# Patient Record
Sex: Male | Born: 1935 | Race: White | Hispanic: No | Marital: Married | Smoking: Former smoker
Health system: Southern US, Community
[De-identification: ages and names within clinical notes are randomized; demographics above are authoritative.]

## PROBLEM LIST (undated history)

## (undated) DIAGNOSIS — M549 Dorsalgia, unspecified: Secondary | ICD-10-CM

## (undated) DIAGNOSIS — G20 Parkinson's disease: Secondary | ICD-10-CM

## (undated) DIAGNOSIS — T7840XA Allergy, unspecified, initial encounter: Secondary | ICD-10-CM

## (undated) DIAGNOSIS — R911 Solitary pulmonary nodule: Secondary | ICD-10-CM

## (undated) DIAGNOSIS — G4733 Obstructive sleep apnea (adult) (pediatric): Secondary | ICD-10-CM

## (undated) DIAGNOSIS — M2550 Pain in unspecified joint: Secondary | ICD-10-CM

## (undated) DIAGNOSIS — N189 Chronic kidney disease, unspecified: Secondary | ICD-10-CM

## (undated) DIAGNOSIS — I700 Atherosclerosis of aorta: Secondary | ICD-10-CM

## (undated) DIAGNOSIS — I10 Essential (primary) hypertension: Secondary | ICD-10-CM

## (undated) DIAGNOSIS — N19 Unspecified kidney failure: Secondary | ICD-10-CM

## (undated) DIAGNOSIS — E785 Hyperlipidemia, unspecified: Secondary | ICD-10-CM

## (undated) DIAGNOSIS — C61 Malignant neoplasm of prostate: Secondary | ICD-10-CM

## (undated) DIAGNOSIS — N400 Enlarged prostate without lower urinary tract symptoms: Secondary | ICD-10-CM

## (undated) DIAGNOSIS — I2699 Other pulmonary embolism without acute cor pulmonale: Secondary | ICD-10-CM

## (undated) DIAGNOSIS — N529 Male erectile dysfunction, unspecified: Secondary | ICD-10-CM

## (undated) DIAGNOSIS — I7143 Infrarenal abdominal aortic aneurysm, without rupture: Secondary | ICD-10-CM

## (undated) DIAGNOSIS — E7800 Pure hypercholesterolemia, unspecified: Secondary | ICD-10-CM

## (undated) DIAGNOSIS — G4730 Sleep apnea, unspecified: Secondary | ICD-10-CM

## (undated) DIAGNOSIS — R058 Other specified cough: Secondary | ICD-10-CM

## (undated) DIAGNOSIS — R55 Syncope and collapse: Secondary | ICD-10-CM

## (undated) DIAGNOSIS — E039 Hypothyroidism, unspecified: Secondary | ICD-10-CM

## (undated) DIAGNOSIS — J449 Chronic obstructive pulmonary disease, unspecified: Secondary | ICD-10-CM

## (undated) DIAGNOSIS — T464X5A Adverse effect of angiotensin-converting-enzyme inhibitors, initial encounter: Secondary | ICD-10-CM

## (undated) DIAGNOSIS — G20A1 Parkinson's disease without dyskinesia, without mention of fluctuations: Secondary | ICD-10-CM

## (undated) DIAGNOSIS — J988 Other specified respiratory disorders: Secondary | ICD-10-CM

## (undated) DIAGNOSIS — I714 Abdominal aortic aneurysm, without rupture, unspecified: Secondary | ICD-10-CM

## (undated) HISTORY — PX: CATARACT EXTRACTION, BILATERAL: SHX1313

## (undated) HISTORY — DX: Hypothyroidism, unspecified: E03.9

## (undated) HISTORY — DX: Abdominal aortic aneurysm, without rupture, unspecified: I71.40

## (undated) HISTORY — DX: Syncope and collapse: R55

## (undated) HISTORY — DX: Male erectile dysfunction, unspecified: N52.9

## (undated) HISTORY — DX: Malignant neoplasm of prostate: C61

## (undated) HISTORY — DX: Unspecified kidney failure: N19

## (undated) HISTORY — DX: Parkinson's disease without dyskinesia, without mention of fluctuations: G20.A1

## (undated) HISTORY — DX: Chronic kidney disease, unspecified: N18.9

## (undated) HISTORY — DX: Pain in unspecified joint: M25.50

## (undated) HISTORY — DX: Pure hypercholesterolemia, unspecified: E78.00

## (undated) HISTORY — DX: Chronic obstructive pulmonary disease, unspecified: J44.9

## (undated) HISTORY — DX: Benign prostatic hyperplasia without lower urinary tract symptoms: N40.0

## (undated) HISTORY — DX: Dorsalgia, unspecified: M54.9

## (undated) HISTORY — DX: Allergy, unspecified, initial encounter: T78.40XA

## (undated) HISTORY — DX: Parkinson's disease: G20

## (undated) HISTORY — DX: Hyperlipidemia, unspecified: E78.5

## (undated) HISTORY — DX: Adverse effect of angiotensin-converting-enzyme inhibitors, initial encounter: T46.4X5A

## (undated) HISTORY — DX: Atherosclerosis of aorta: I70.0

## (undated) HISTORY — DX: Other specified respiratory disorders: J98.8

## (undated) HISTORY — DX: Solitary pulmonary nodule: R91.1

## (undated) HISTORY — DX: Sleep apnea, unspecified: G47.30

## (undated) HISTORY — PX: PROSTATE SURGERY: SHX751

## (undated) HISTORY — DX: Abdominal aortic aneurysm, without rupture: I71.4

## (undated) HISTORY — DX: Infrarenal abdominal aortic aneurysm, without rupture: I71.43

## (undated) HISTORY — DX: Essential (primary) hypertension: I10

## (undated) HISTORY — DX: Obstructive sleep apnea (adult) (pediatric): G47.33

## (undated) HISTORY — DX: Other specified cough: R05.8

## (undated) HISTORY — PX: PROSTATE CRYOABLATION: SUR358

## (undated) HISTORY — DX: Other pulmonary embolism without acute cor pulmonale: I26.99

## (undated) HISTORY — PX: OTHER SURGICAL HISTORY: SHX169

---

## 1999-06-30 ENCOUNTER — Encounter: Payer: Self-pay | Admitting: Pulmonary Disease

## 1999-09-20 ENCOUNTER — Ambulatory Visit: Admission: RE | Admit: 1999-09-20 | Discharge: 1999-09-20 | Payer: Self-pay | Admitting: Pulmonary Disease

## 1999-09-20 ENCOUNTER — Encounter: Payer: Self-pay | Admitting: Pulmonary Disease

## 1999-11-21 ENCOUNTER — Encounter: Admission: RE | Admit: 1999-11-21 | Discharge: 2000-02-19 | Payer: Self-pay | Admitting: Pulmonary Disease

## 2000-01-29 ENCOUNTER — Ambulatory Visit (HOSPITAL_BASED_OUTPATIENT_CLINIC_OR_DEPARTMENT_OTHER): Admission: RE | Admit: 2000-01-29 | Discharge: 2000-01-29 | Payer: Self-pay | Admitting: Pulmonary Disease

## 2001-11-18 ENCOUNTER — Encounter: Admission: RE | Admit: 2001-11-18 | Discharge: 2001-11-18 | Payer: Self-pay | Admitting: Internal Medicine

## 2001-11-18 ENCOUNTER — Encounter: Payer: Self-pay | Admitting: Internal Medicine

## 2002-10-07 ENCOUNTER — Ambulatory Visit (HOSPITAL_BASED_OUTPATIENT_CLINIC_OR_DEPARTMENT_OTHER): Admission: RE | Admit: 2002-10-07 | Discharge: 2002-10-07 | Payer: Self-pay | Admitting: Orthopedic Surgery

## 2004-04-25 ENCOUNTER — Inpatient Hospital Stay (HOSPITAL_COMMUNITY): Admission: AD | Admit: 2004-04-25 | Discharge: 2004-04-27 | Payer: Self-pay | Admitting: Internal Medicine

## 2004-06-03 ENCOUNTER — Ambulatory Visit (HOSPITAL_COMMUNITY): Admission: RE | Admit: 2004-06-03 | Discharge: 2004-06-03 | Payer: Self-pay | Admitting: Internal Medicine

## 2004-07-31 HISTORY — PX: OTHER SURGICAL HISTORY: SHX169

## 2004-10-11 ENCOUNTER — Ambulatory Visit (HOSPITAL_COMMUNITY): Admission: RE | Admit: 2004-10-11 | Discharge: 2004-10-11 | Payer: Self-pay | Admitting: Internal Medicine

## 2005-11-17 ENCOUNTER — Inpatient Hospital Stay (HOSPITAL_COMMUNITY): Admission: EM | Admit: 2005-11-17 | Discharge: 2005-11-25 | Payer: Self-pay | Admitting: Emergency Medicine

## 2005-11-21 ENCOUNTER — Encounter (INDEPENDENT_AMBULATORY_CARE_PROVIDER_SITE_OTHER): Payer: Self-pay | Admitting: *Deleted

## 2005-12-08 ENCOUNTER — Encounter: Admission: RE | Admit: 2005-12-08 | Discharge: 2005-12-08 | Payer: Self-pay | Admitting: Cardiothoracic Surgery

## 2005-12-13 ENCOUNTER — Ambulatory Visit: Payer: Self-pay | Admitting: Pulmonary Disease

## 2006-11-29 ENCOUNTER — Ambulatory Visit: Payer: Self-pay | Admitting: Pulmonary Disease

## 2007-01-28 ENCOUNTER — Ambulatory Visit: Payer: Self-pay | Admitting: Pulmonary Disease

## 2007-10-31 ENCOUNTER — Telehealth (INDEPENDENT_AMBULATORY_CARE_PROVIDER_SITE_OTHER): Payer: Self-pay | Admitting: *Deleted

## 2007-11-30 ENCOUNTER — Encounter: Payer: Self-pay | Admitting: Pulmonary Disease

## 2007-11-30 DIAGNOSIS — I10 Essential (primary) hypertension: Secondary | ICD-10-CM | POA: Insufficient documentation

## 2007-11-30 DIAGNOSIS — J189 Pneumonia, unspecified organism: Secondary | ICD-10-CM | POA: Insufficient documentation

## 2007-11-30 DIAGNOSIS — J438 Other emphysema: Secondary | ICD-10-CM | POA: Insufficient documentation

## 2008-06-08 ENCOUNTER — Ambulatory Visit: Payer: Self-pay | Admitting: Pulmonary Disease

## 2008-07-04 ENCOUNTER — Encounter: Payer: Self-pay | Admitting: Pulmonary Disease

## 2008-09-01 ENCOUNTER — Ambulatory Visit (HOSPITAL_COMMUNITY): Admission: RE | Admit: 2008-09-01 | Discharge: 2008-09-01 | Payer: Self-pay | Admitting: Urology

## 2008-12-01 ENCOUNTER — Encounter: Admission: RE | Admit: 2008-12-01 | Discharge: 2008-12-01 | Payer: Self-pay | Admitting: Urology

## 2008-12-07 ENCOUNTER — Ambulatory Visit (HOSPITAL_BASED_OUTPATIENT_CLINIC_OR_DEPARTMENT_OTHER): Admission: RE | Admit: 2008-12-07 | Discharge: 2008-12-07 | Payer: Self-pay | Admitting: Urology

## 2008-12-16 ENCOUNTER — Ambulatory Visit: Payer: Self-pay | Admitting: Vascular Surgery

## 2009-06-07 ENCOUNTER — Ambulatory Visit: Payer: Self-pay | Admitting: Pulmonary Disease

## 2009-06-12 DIAGNOSIS — G4733 Obstructive sleep apnea (adult) (pediatric): Secondary | ICD-10-CM | POA: Insufficient documentation

## 2009-12-17 ENCOUNTER — Ambulatory Visit: Payer: Self-pay | Admitting: Vascular Surgery

## 2010-03-16 ENCOUNTER — Ambulatory Visit: Payer: Self-pay | Admitting: Pulmonary Disease

## 2010-03-16 DIAGNOSIS — J439 Emphysema, unspecified: Secondary | ICD-10-CM | POA: Insufficient documentation

## 2010-03-16 DIAGNOSIS — C61 Malignant neoplasm of prostate: Secondary | ICD-10-CM | POA: Insufficient documentation

## 2010-03-23 ENCOUNTER — Encounter: Payer: Self-pay | Admitting: Pulmonary Disease

## 2010-03-25 ENCOUNTER — Telehealth (INDEPENDENT_AMBULATORY_CARE_PROVIDER_SITE_OTHER): Payer: Self-pay | Admitting: *Deleted

## 2010-03-25 ENCOUNTER — Ambulatory Visit: Payer: Self-pay | Admitting: Pulmonary Disease

## 2010-03-25 DIAGNOSIS — R55 Syncope and collapse: Secondary | ICD-10-CM | POA: Insufficient documentation

## 2010-03-28 ENCOUNTER — Encounter: Payer: Self-pay | Admitting: Pulmonary Disease

## 2010-04-13 ENCOUNTER — Ambulatory Visit: Payer: Self-pay | Admitting: Pulmonary Disease

## 2010-04-15 ENCOUNTER — Encounter: Payer: Self-pay | Admitting: Pulmonary Disease

## 2010-04-15 DIAGNOSIS — R0989 Other specified symptoms and signs involving the circulatory and respiratory systems: Secondary | ICD-10-CM | POA: Insufficient documentation

## 2010-04-15 DIAGNOSIS — R0609 Other forms of dyspnea: Secondary | ICD-10-CM | POA: Insufficient documentation

## 2010-04-18 ENCOUNTER — Encounter: Payer: Self-pay | Admitting: Pulmonary Disease

## 2010-05-10 ENCOUNTER — Ambulatory Visit (HOSPITAL_COMMUNITY): Admission: RE | Admit: 2010-05-10 | Discharge: 2010-05-10 | Payer: Self-pay | Admitting: Pulmonary Disease

## 2010-05-10 ENCOUNTER — Encounter: Payer: Self-pay | Admitting: Pulmonary Disease

## 2010-05-18 ENCOUNTER — Ambulatory Visit: Payer: Self-pay | Admitting: Pulmonary Disease

## 2010-05-21 ENCOUNTER — Ambulatory Visit: Payer: Self-pay | Admitting: Internal Medicine

## 2010-08-20 ENCOUNTER — Encounter: Payer: Self-pay | Admitting: Internal Medicine

## 2010-08-21 ENCOUNTER — Encounter: Payer: Self-pay | Admitting: Internal Medicine

## 2010-08-30 NOTE — Assessment & Plan Note (Signed)
Summary: consult for copd, dyspnea   Copy to:  Christopher Wilkerson Primary Christopher Wilkerson/Referring Christopher Wilkerson:  Christopher Wilkerson  CC:  Pulmonary Consult.  History of Present Illness: The pt is a 75y/o male who I have been asked to see for copd, dyspnea.  The pt carries the diagnosis of mild copd from spirometry 3-4 yrs ago, and really has not had issues with it.  He has noted progressive doe over the last 6-64mos, to the point it is now interfering with his QOL.  He is very active, and has seen great day to day variability in his exertional tolerance.  On good days he can paddle a kayak, bike, and have excellent exercise tolerance.  On bad days, he feels he can't do anything physical.  He can get winded bringing groceries in from the car or getting newspaper on the bad days.  He denies any cough, congestion, LE edema, chest pain, or upper airway issues or discomfort.  He denies any cardiac issues.  His weight has actually decreased over the past 2 years.  He has not had a recent cxr or pfts.  Preventive Screening-Counseling & Management  Alcohol-Tobacco     Smoking Status: quit  Current Medications (verified): 1)  Spiriva Handihaler 18 Mcg Caps (Tiotropium Bromide Monohydrate) .... Inhale 1 Capsule As Directed Once  A Day 2)  Doxazosin Mesylate 8 Mg  Tabs (Doxazosin Mesylate) .... Take 1 Tablet By Mouth Once A Day 3)  Lotrel 10-40 Mg  Caps (Amlodipine Besy-Benazepril Hcl) .... Take 1 Tablet By Mouth Once A Day 4)  Aspirin Low Dose 81 Mg Tabs (Aspirin) .... Take 1 Tablet By Mouth Once A Day 5)  Cpap 6)  Proventil Hfa 108 (90 Base) Mcg/act Aers (Albuterol Sulfate) .... 2 Puffs Every 4 Hrs As Needed 7)  Glucosamine-Chondroitin  Caps (Glucosamine-Chondroit-Vit C-Mn) .... Take 1 Tablet By Mouth Once A Day  Allergies (verified): 1)  ! * Ivp Dye  Past History:  Past Medical History: COPD PROSTATE CANCER (ICD-185) OBSTRUCTIVE SLEEP APNEA (ICD-327.23) HYPERTENSION (ICD-401.9) h/o spont. ptx with bleb  stapling in 2003.     Past Surgical History: Repair right index finger torn collatral ligament by Dr Fredna Dow prostate cryoablation  s/p vats for spontaneous ptx 2003  Family History: Reviewed history and no changes required. heart disease: father, brother cancer: mother (ovarian, lung) maternal grandfather (colon)   Social History: Reviewed history and no changes required. Patient states former smoker.  started at age 9.  2 ppd.  quit July 1976. pt is married and lives with wife. pt has children. pt is retired.  prev worked as an Recruitment consultant  Review of Systems       The patient complains of shortness of breath with activity and shortness of breath at rest.  The patient denies productive cough, non-productive cough, coughing up blood, chest pain, irregular heartbeats, acid heartburn, indigestion, loss of appetite, weight change, abdominal pain, difficulty swallowing, sore throat, tooth/dental problems, headaches, nasal congestion/difficulty breathing through nose, sneezing, itching, ear ache, anxiety, depression, hand/feet swelling, joint stiffness or pain, rash, change in color of mucus, and fever.    Vital Signs:  Patient profile:   75 year old male Height:      72 inches Weight:      211 pounds BMI:     28.72 O2 Sat:      95 % on Room air Temp:     97.9 degrees F oral Pulse rate:   69 / minute BP sitting:   140 /  70  (left arm) Cuff size:   regular  Vitals Entered By: Matthew Folks LPN (August 17, 624THL 9:53 AM)  O2 Flow:  Room air CC: Pulmonary Consult Comments Medications reviewed with patient Matthew Folks LPN  August 17, 624THL 10:03 AM    Physical Exam  General:  wd male in nad  Eyes:  PERRLA and EOMI.   Nose:  patent without discharge Mouth:  clear without lesions or exudates. Neck:  no jvd, tmg, LN Lungs:  clear to auscultation Heart:  rrr, no mrg Abdomen:  soft and nontender, bs+ Extremities:  no edema or cyanosis  pulses intact  distally Neurologic:  alert and oriented, moves all 4.    Impression & Recommendations:  Problem # 1:  COPD, MILD (ICD-496) the pt has mild airflow obstruction on pfts today with a normal DLCO.  He has a smoking history, but very remote.  His history is more suggestive of asthma than emphysema given his significant day to day variability.  Will change his spiriva to symbicort given my concern more for asthma, and see how he does.  I am not totally convinced this is the etiology for his symptoms, and would suggest cardiac evaluation if he does not see improvement with the medication change.  My only other thought is the pt has an abnormal flow volume loop with truncation of the inspiratory limb.  This sometimes indicates a variable extrathoracic obstrution.  However, he appeared identical to the loop in 2008, and he has no symptoms or findings that are suspicious enough to pursue at this time.  Medications Added to Medication List This Visit: 1)  Glucosamine-chondroitin Caps (Glucosamine-chondroit-vit c-mn) .... Take 1 tablet by mouth once a day  Other Orders: Consultation Level IV [id number]) T- * Misc. Laboratory test [id number]) T-2 View CXR ([id number])  Patient Instructions: 1)  stop spiriva for now 2)  trial of symbicort 160/4.5  2 inhalations each am and pm...rinse mouth well. 3)  will check blood test today to evaluate for hereditary form of emphysema. 4)  will check cxr today, and call you with results. 5)  followup with me in 4weeks.

## 2010-08-30 NOTE — Assessment & Plan Note (Signed)
Summary: rov for copd, dyspnea   Visit Type:  Follow-up Copy to:  [place] Primary Provider/Referring Provider:  Seward Carol  CC:  follow up. pt states breathing is doing good and is having no porblems with it. Pt states he currently has a cold so other than the problems from that he is doing fine. Pt states he uses cpap everynight x 8 hrs a night. Pt states he is having no problems with mask/machine. Marland Kitchen  History of Present Illness: the pt comes in today for f/u of his known mild copd and osa.  He has been having issues with what sounds like syncope, and increased sob beyond his usual baseline.  He has been evaluated by cardiology, and we have done a CPST to evaluate his worsening dyspnea.  This test is pending at the time of OV.  The pt in the meantime is returning to his usual baseline.  He denies any cough or congestion, no chest pain, and no LE edema.  Current Medications (verified): 1)  Symbicort 160-4.5 Mcg/act  Aero (Budesonide-Formoterol Fumarate) .... Two Puffs Twice Daily 2)  Doxazosin Mesylate 8 Mg  Tabs (Doxazosin Mesylate) .... Take 1 Tablet By Mouth Once A Day 3)  Lotrel 10-40 Mg  Caps (Amlodipine Besy-Benazepril Hcl) .... Take 1 Tablet By Mouth Once A Day 4)  Aspirin Low Dose 81 Mg Tabs (Aspirin) .... Take 1 Tablet By Mouth Once A Day 5)  Cpap 6)  Proventil Hfa 108 (90 Base) Mcg/act Aers (Albuterol Sulfate) .... 2 Puffs Every 4 Hrs As Needed 7)  Glucosamine-Chondroitin  Caps (Glucosamine-Chondroit-Vit C-Mn) .... Take 1 Tablet By Mouth Once A Day 8)  Finasteride 5 Mg Tabs (Finasteride) .... Take 1 Tablet By Mouth Once A Day  Allergies (verified): 1)  ! * Ivp Dye  Review of Systems       The patient complains of non-productive cough and nasal congestion/difficulty breathing through nose.  The patient denies shortness of breath with activity, shortness of breath at rest, productive cough, coughing up blood, chest pain, irregular heartbeats, acid heartburn, indigestion, loss of  appetite, weight change, abdominal pain, difficulty swallowing, sore throat, tooth/dental problems, headaches, sneezing, itching, ear ache, anxiety, depression, hand/feet swelling, joint stiffness or pain, rash, change in color of mucus, and fever.    Vital Signs:  Patient profile:   75 year old male Height:      71 inches Weight:      216 pounds BMI:     30.23 O2 Sat:      95 % on Room air Temp:     98.2 degrees F oral Pulse rate:   47 / minute BP sitting:   134 / 78  (right arm) Cuff size:   regular  Vitals Entered By: Charma Igo (May 18, 2010 11:57 AM)  O2 Flow:  Room air CC: follow up. pt states breathing is doing good and is having no porblems with it. Pt states he currently has a cold so other than the problems from that he is doing fine. Pt states he uses cpap everynight x 8 hrs a night. Pt states he is having no problems with mask/machine.  Comments meds and allergies updated Phone number updated Charma Igo  May 18, 2010 11:57 AM    Physical Exam  General:  ow male in nad Lungs:  totally clear to auscultation Heart:  rrr, no mrg Extremities:  no edema or cyanosis  Neurologic:  alert and oriented, moves all 4.   Impression & Recommendations:  Problem # 1:  COPD, MILD (ICD-496) the pt has known mild obstructive disease, and is maintaining on symbicort.  I do not think his recent "episodes/syncope" have anything to do with his underlying lung disease.  He has gotten over those episodes, and feels that his exertional tolerance is slowly returning to baseline.  His CPST results are not available currently, but will track these down.  Problem # 2:  OBSTRUCTIVE SLEEP APNEA (ICD-327.23) the pt is doing well with cpap, and reports no issues with tolerance or symptoms.  Problem # 3:  SYNCOPE (ICD-780.2) the pt had an unremarkable event monitor by his history, and his CPST showed  Other Orders: Est. Patient Level III [id number])  Patient Instructions: 1)  stay  active, and call if further breathing issues 2)  will get report of your cpst, and call you with results. 3)  followup with me in one year, or sooner if having issues.

## 2010-08-30 NOTE — Letter (Signed)
Summary: CPST Sales promotion account executive Pulmonary  [place], [place] [address]   Phone: [phone]  Fax: [phone]     Patient's Name: Christopher Wilkerson Date of Birth: September 01, 1935 MRN: [id number]  CPST  Choose test method and choice  a)___Bike - recommended by ATS/ACCP. Do at [facility] at Dr. Haroldine [facility]  b)___Treadmill - less preferred. Do at [facility] at Dr. Haroldine Laws lab or do at [facility] PFT lab  Choose one or more indication for test  INDICATIONS FOR CARDIOPULMONARY EXERCISE TESTING Evaluation of exercise tolerance ______ Determination of functional impairment or capacity (peak V? O2) ______ Determination of exercise-limiting factors and pathophysiologic mechanisms  Evaluation of undiagnosed exercise intolerance _____ Assessing contribution of cardiac and pulmonary etiology in coexisting disease _____ Symptoms disproportionate to resting pulmonary and cardiac tests  _____Unexplained dyspnea when initial cardiopulmonary testing is nondiagnostic  Evaluation of patients with cardiovascular disease _____ Functional evaluation and prognosis in patients with heart failure _____ Selection for cardiac transplantation _____ Exercise prescription and monitoring response to exercise training for cardiac rehabilitation (special circumstances; i.e., pacemakers)  Evaluation of patients with respiratory disease _____ Functional impairment assessment (see specific clinical applications)  _____Chronic obstructive pulmonary disease Establishing exercise limitation(s) and assessing other potential contributing factors, especially occult heart disease (ischemia) ______Determination of magnitude of hypoxemia and for O2 prescription When objective determination of therapeutic intervention is necessary and not adequately addressed by standard pulmonary function testing  _____ Interstitial lung diseases _____Detection of early (occult) gas exchange abnormalities _____Overall  assessment/monitoring of pulmonary gas exchange _____Determination of magnitude of hypoxemia and for O2 prescription _____Determination of potential exercise-limiting factors _____Documentation of therapeutic response to potentially toxic therapy  ____ Pulmonary vascular disease (careful risk-benefit analysis required)  ____ Cystic fibrosis  ____ Exercise-induced bronchospasm  Specific clinical applications ____  Preoperative evaluation _____Lung resectional surgery _____Elderly patients undergoing major abdominal surgery _____Lung volume resectional surgery for emphysema (currently investigational)  ____ Exercise evaluation and prescription for pulmonary rehabilitation  ____ Evaluation for impairment-disability  ____ Evaluation for lung, heart-lung transplantation ____ Definition of abbreviation: V? O2______ -oxygen consumption.    [place] [facility]

## 2010-08-30 NOTE — Assessment & Plan Note (Signed)
Summary: acute sick visit for syncope, dyspnea   CC:  Pt is here for a f/u appt.  Pt states breathing has worsened since switching from Spiriva to Symbicort.  Pt also states he was at [facility] - d/c'd yesterday. Pt states he was riding bike and "passed out."  .  History of Present Illness: the pt comes in today for evaluation of his breathing after a recent "episode" that required hospitalization in [place].  He has known mild emphysema, and was recently started on symbicort as a trial.  He recently was bking at a park, and noticed shortness of breath to the point that he could not get enough air.  He stopped, got off his bike, and was later told by his wife that he was ashen and extremely diaphoretic.  The pt lost consciousness, and was taken to [place].  There apparently was no motor activity, and the pt thought at the time he might be "wheezing" but his wife did not hear this.  He denies stridor.  His sats in the ER were excellent per the notes obtained, and his lungs were clear according to the notes from the various consults obtained.    Current Medications (verified): 1)  Symbicort 160-4.5 Mcg/act  Aero (Budesonide-Formoterol Fumarate) .... Two Puffs Twice Daily 2)  Doxazosin Mesylate 8 Mg  Tabs (Doxazosin Mesylate) .... Take 1 Tablet By Mouth Once A Day 3)  Lotrel 10-40 Mg  Caps (Amlodipine Besy-Benazepril Hcl) .... Take 1 Tablet By Mouth Once A Day 4)  Aspirin Low Dose 81 Mg Tabs (Aspirin) .... Take 1 Tablet By Mouth Once A Day 5)  Cpap 6)  Proventil Hfa 108 (90 Base) Mcg/act Aers (Albuterol Sulfate) .... 2 Puffs Every 4 Hrs As Needed 7)  Glucosamine-Chondroitin  Caps (Glucosamine-Chondroit-Vit C-Mn) .... Take 1 Tablet By Mouth Once A Day 8)  Finasteride 5 Mg Tabs (Finasteride) .... Take 1 Tablet By Mouth Once A Day  Allergies (verified): 1)  ! * Ivp Dye  Past History:  Past medical, surgical, family and social histories (including risk factors) reviewed, and no changes noted  (except as noted below).  Past Surgical History: Reviewed history from 11/30/2007 and no changes required. Repair right index finger torn collatral ligament by Dr Fredna Dow  Family History: Reviewed history and no changes required.  Social History: Reviewed history and no changes required.  Review of Systems       The patient complains of shortness of breath with activity, productive cough, non-productive cough, and nasal congestion/difficulty breathing through nose.  The patient denies shortness of breath at rest, coughing up blood, chest pain, irregular heartbeats, acid heartburn, indigestion, loss of appetite, weight change, abdominal pain, difficulty swallowing, sore throat, tooth/dental problems, headaches, sneezing, itching, ear ache, anxiety, depression, hand/feet swelling, joint stiffness or pain, rash, change in color of mucus, and fever.    Vital Signs:  Patient profile:   75 year old male Height:      71 inches Weight:      213.13 pounds BMI:     29.83 O2 Sat:      99 % on Room air Temp:     97.5 degrees F oral Pulse rate:   62 / minute BP sitting:   128 / 70  (left arm) Cuff size:   regular  Vitals Entered By: Matthew Folks LPN (August 26, 624THL 1:36 PM)  O2 Flow:  Room air CC: Pt is here for a f/u appt.  Pt states breathing has worsened since switching from  Spiriva to Symbicort.  Pt also states he was at [facility] - d/c'd yesterday. Pt states he was riding bike and "passed out."   Comments Medications reviewed with patient Matthew Folks LPN  August 26, 624THL 1:40 PM    Physical Exam  General:  wd male in nad Nose:  no drainage noted. Lungs:  clear to auscultation Heart:  rrr Extremities:  no edema, no cyanosis Neurologic:  alert and oriented, moves all 4.   Impression & Recommendations:  Problem # 1:  SYNCOPE (ICD-780.2) the pt has had a very thorough evaluation, with nothing specific being found.  I continue to be concerned about a possible arrhythmia or  cardiac issue, and think he should have some kind of f/u with a cardiologist here.  He is very worried about getting back to his usual exercise regimen, and I wonder if an event monitor would be helpful??  Problem # 2:  COPD, MILD (ICD-496) the pt has mild disease, and we need to continue his symbicort trial to see if improves his exercise tolerance.  I do not think this had anything to do with his "event" based on the pt's description and notes from [place].  There was nothing to suggest acute bronchospasm, but I can't exclude an upper airway issue.  He does have the suggestion of a truncated inspiratory limb on his FVL, and can refer to ENT for upper airway exam if this type of thing occurs again.    Medications Added to Medication List This Visit: 1)  Symbicort 160-4.5 Mcg/act Aero (Budesonide-formoterol fumarate) .... Two puffs twice daily 2)  Finasteride 5 Mg Tabs (Finasteride) .... Take 1 tablet by mouth once a day  Other Orders: Est. Patient Level IV [id number]) Cardiology Referral (Cardiology)  Patient Instructions: 1)  stay on symbicort as you are doing 2)  keep rescue inhaler available at all times 3)  will refer to [facility] Cardiology for their input.  ?needs event monitor. 4)  please call if having changes in throat or upper airway that is related to constriction. 5)  keep f/u visit with me.

## 2010-08-30 NOTE — Consult Note (Signed)
Summary: [facility]   Imported By: Phillis Knack 04/27/2010 12:05:05  _____________________________________________________________________  External Attachment:    Type:   Image     Comment:   External Document

## 2010-08-30 NOTE — Miscellaneous (Signed)
Summary: Orders Update pft charges  Clinical Lists Changes  Orders: Added new Service order of Carbon Monoxide diffusing w/capacity (94720) - Signed Added new Service order of Lung Volumes (94240) - Signed Added new Service order of Spirometry (Pre & Post) (94060) - Signed 

## 2010-08-30 NOTE — Progress Notes (Signed)
Summary: returning call   Phone Note Call from Patient Call back at Home Phone [phone]   Caller: Patient Call For: clance Summary of Call: Returning Megan's call. Initial call taken by: Netta Neat,  March 25, 2010 8:50 AM  Follow-up for Phone Call        Called, spoke with pt.  Pt informed Jinny Blossom was calling to inform him Alpha 1 was normal per [place].  He verbalized understanding of these results.    While on the phone, pt stated he was discharged yesterday from [facility] for a "pulmonary problem."  He is reqeusting a f/u with [facility] since this hospital admission.  HFU scheduled with [facility] for today at 1:30 -- pt aware and also requesting I call [facility] to have records faxed here prior to his arrival.  States he believes he has already filled out a release of info for Dr. Gwenette Greet to receive these records.    [place], spoke with Vira Agar in Medical Records.  She will fax records to triage fax number.  Will give to [facility] once received.   Follow-up by: Raymondo Band RN,  March 25, 2010 9:36 AM  Additional Follow-up for Phone Call Additional follow up Details #1::        Records received and given to [facility] for today's visit.  Raymondo Band RN  March 25, 2010 9:50 AM

## 2010-08-30 NOTE — Assessment & Plan Note (Signed)
Summary: rov for mild copd, episodic dyspnea   Copy to:  [place] Primary Provider/Referring Provider:  Seward Carol  CC:  4 week follow up. Pt states breathins is the same from last visit. Pt states he has been having some sinus drainage. Pt states he is not doing any better on the symbicort.  History of Present Illness: the pt comes in today for f/u after starting on symbicort for his mild obstructive disease on pfts.  He feels that his breathing is no different, and continues to have episodes where he is breathless and unable to do even mildly exertional activities.  He is being evaluated  by cardiology for his syncopal episode, and currently wearing an event monitor.  He denies any chest congestion, cough, or purulence.  Current Medications (verified): 1)  Symbicort 160-4.5 Mcg/act  Aero (Budesonide-Formoterol Fumarate) .... Two Puffs Twice Daily 2)  Doxazosin Mesylate 8 Mg  Tabs (Doxazosin Mesylate) .... Take 1 Tablet By Mouth Once A Day 3)  Lotrel 10-40 Mg  Caps (Amlodipine Besy-Benazepril Hcl) .... Take 1 Tablet By Mouth Once A Day 4)  Aspirin Low Dose 81 Mg Tabs (Aspirin) .... Take 1 Tablet By Mouth Once A Day 5)  Cpap 6)  Proventil Hfa 108 (90 Base) Mcg/act Aers (Albuterol Sulfate) .... 2 Puffs Every 4 Hrs As Needed 7)  Glucosamine-Chondroitin  Caps (Glucosamine-Chondroit-Vit C-Mn) .... Take 1 Tablet By Mouth Once A Day 8)  Finasteride 5 Mg Tabs (Finasteride) .... Take 1 Tablet By Mouth Once A Day  Allergies (verified): 1)  ! * Ivp Dye  Review of Systems       The patient complains of shortness of breath with activity, productive cough, and nasal congestion/difficulty breathing through nose.  The patient denies shortness of breath at rest, non-productive cough, coughing up blood, chest pain, irregular heartbeats, acid heartburn, indigestion, loss of appetite, weight change, abdominal pain, difficulty swallowing, sore throat, tooth/dental problems, headaches, sneezing, itching, ear  ache, anxiety, depression, hand/feet swelling, joint stiffness or pain, rash, change in color of mucus, and fever.    Vital Signs:  Patient profile:   75 year old male Height:      71 inches Weight:      213 pounds BMI:     29.81 O2 Sat:      96 % on Room air Temp:     97.9 degrees F oral Pulse rate:   58 / minute BP sitting:   122 / 80  (left arm) Cuff size:   regular  Vitals Entered By: Charma Igo (April 13, 2010 10:29 AM)  O2 Flow:  Room air CC: 4 week follow up. Pt states breathins is the same from last visit. Pt states he has been having some sinus drainage. Pt states he is not doing any better on the symbicort Comments meds and allergies udpated Phone number updated Charma Igo  April 13, 2010 10:32 AM    Physical Exam  General:  wd male in nad Lungs:  clear to auscultation Heart:  rrr Extremities:  no edema or cyanosis  Neurologic:  alert and oriented, moves all 4.   Impression & Recommendations:  Problem # 1:  COPD, MILD (ICD-496) the pt has mild airflow obstruction by recent spirometry, and is being tried on symbicort to see if makes a difference in his complaint of worsening doe.  He has really not seen a difference, but I have asked him to stay on the med for now while we try and sort out the etiology  for this complaint.  I suspect his mild obstructive disease has nothing to do with his episodic dyspnea, and will consider doing an CPST to help with diagnosis.    Problem # 2:  SYNCOPE (ICD-780.2) the pt is currently wearing an event monitor as part of a w/u for his syncopal episode.  It is unclear if this is all tied in with his sob episodes as well.  Other Orders: Est. Patient Level III [id number]) Flu Vaccine 58yrs + MEDICARE PATIENTS [id number]) Administration Flu vaccine - MCR [id number]) Cardio-Pulmonary Stress Test Referral (Cardio-Pulmon)  Patient Instructions: 1)  stay on symbicort for now everyday 2)  will speak with cardiology about your event monitor,  and may arrange cardiopulmonary exercise test.   Immunization History:  Pneumovax Immunization History:    Pneumovax:  historical (04/30/2006)    Flu Vaccine Consent Questions     Do you have a history of severe allergic reactions to this vaccine? no    Any prior history of allergic reactions to egg and/or gelatin? no    Do you have a sensitivity to the preservative Thimersol? no    Do you have a past history of Guillan-Barre Syndrome? no    Do you currently have an acute febrile illness? no    Have you ever had a severe reaction to latex? no    Vaccine information given and explained to patient? yes    Are you currently pregnant? no    Lot Number:[id number]   Exp Date:01/28/2011   Site Given  Left Deltoid IMflu   Charma Igo  April 13, 2010 12:03 PM

## 2010-08-30 NOTE — Letter (Signed)
Summary: CPST- R/O Contraindications  [facility]  [address], [place] [address]   Phone: [phone]  Fax: [phone]    Patient's Name: Christopher Wilkerson Date of Birth: [birth year] MRN: [id number]  *********Rule out Contraindications**************** Absolute                                                                                                                           ___ Acute MI (3-5 Days)                                 ___ Unstable Angina                                          ___ Uncontrolled arrhythmias causing symptoms or hemodynamic compromise. ___ Syncope                                                     ___ Active endocarditis                                         ___ Acute Myocarditis/Pericarditis                        ___ Symptomatic severe aortic stenosis  ___ Acute Pulmonary embolus or pulmonary infarction                ___ Uncontrolled Heart Failure  ___ Thrombosis of lower extremitie ___ Suspected dissecting aneurysm ___ Uncontrolled Asthma                          ___ Pulmonary Edema                                        ___ RA desat @ rest<85%                                      ___ Repiratory Failure                                         ___ Acute noncardiopulmonary disorder that may affect exercise performance or be         aggravated by exercise (ie infection, renal failure,  thyrotoxicosis) .                               ___ Mental impairment leading to inabliity to cooperate   Relative ___ Left main coronary stenosis or its equivalent ___ Moderate stentoic valvular heart disease ___ Severe untreated arterial hypertension @ rest (<200 mmHg             99991111 Diastolic ___ Tachy/Brady Arrhythmias ___ High- degree artioventricular block ___ Hypertrophic cardiomyopathy ___ Significant pulmonary hypertension ___ Advanced or complicated pregnancy ___ Electrolyte abnormalities ___ Orthopedic impairment that  compromises exercise performance    [facility]

## 2010-08-30 NOTE — Letter (Signed)
Summary: [facility]   Imported By: Bubba Hales 04/12/2010 11:25:21  _____________________________________________________________________  External Attachment:    Type:   Image     Comment:   External Document

## 2010-11-08 LAB — POCT I-STAT 4, (NA,K, GLUC, HGB,HCT)
Hemoglobin: 16.3 g/dL (ref 13.0–17.0)
Potassium: 5.6 mEq/L — ABNORMAL HIGH (ref 3.5–5.1)
Sodium: 135 mEq/L (ref 135–145)

## 2010-12-13 NOTE — Consult Note (Signed)
NEW PATIENT CONSULTATION   Christopher Wilkerson, Christopher Wilkerson  DOB:  [birth year]                                       12/16/2008  CHART#:[id number]   The patient presents today for discussion of incidental finding of  infrarenal abdominal aortic aneurysm.  He is a very pleasant healthy 75-  year-old gentleman with a recent diagnosis of prostate cancer.  He  underwent a CT scan to rule out metastatic disease and had an incidental  finding of a 3 cm infrarenal abdominal aortic aneurysm.  He had no prior  knowledge of this and does not have any history of arterial  insufficiency.   PAST MEDICAL HISTORY:  Significant for hypertension and COPD and  emphysema.   He does have a family history of premature atherosclerotic disease in  his mother, father and brothers.  His father died of ruptured aneurysm  at age 23.   SOCIAL HISTORY:  He is married with 4 children.  He is retired.  He quit  smoking in 1979.  He does have one or two alcohol drinks per day.   REVIEW OF SYSTEMS:  Weight is reported at 206 pounds, he is 5 feet 10  inches tall.  He does have shortness of breath with exertion.  He does  have kidney disease, arthritis.  Review of systems otherwise negative.   ALLERGIES:  Iodine dyes.   MEDICATION LIST:  Attached.   PHYSICAL EXAMINATION:  Vital Signs:  Blood pressure is 98/66, pulse 68,  respirations 18.  General:  He is a well-developed, well-nourished white  male appearing stated age 5.  His radial pulses are 2+ bilaterally.  He  has 2+ femoral, 2+ popliteal and 2+ posterior tibial pulses with no  evidence of peripheral aneurysm.  Abdomen:  Reveals no tenderness and I  do not feel any masses and I do not feel an aneurysm.   I reviewed a CT report, this does explain that he has a 3 cm infrarenal  aneurysm with atherosclerotic changes.  I discussed this at length with  the patient and his wife present.  I explained the small size of his  aneurysm and likelihood that this  will remain to be something we could  watch lifelong without treatment.  I did explain the natural history of  slow continued growth and that this may get to the point where we are  concerned about rupture and would recommend treatment at that time.  We  will see him again in 1 year with ultrasound of his aneurysm.   Rosetta Posner, M.D.  Electronically Signed   TFE/MEDQ  D:  12/16/2008  T:  12/17/2008  Job:  [id number]   cc:   Pierre Bali I. Gaynelle Arabian, M.D.  Ashby Dawes. Polite, M.D.

## 2010-12-13 NOTE — Procedures (Signed)
DUPLEX ULTRASOUND OF ABDOMINAL AORTA   INDICATION:  Abdominal aortic aneurysm.   HISTORY:  Diabetes:  No.  Cardiac:  No.  Hypertension:  Yes.  Smoking:  No.  Connective Tissue Disorder:  Family History:  No.  Previous Surgery:  No.   DUPLEX EXAM:         AP (cm)                   TRANSVERSE (cm)  Proximal             2.6 cm                    2.6 cm  Mid                  3.1 cm                    2.8 cm  Distal               2.6 cm                    3.2 cm  Right Iliac          1.4 cm                    1.7 cm  Left Iliac           1.3 cm                    1.5 cm   PREVIOUS:  Date:  AP:  TRANSVERSE:   IMPRESSION:  1. Abdominal aortic aneurysm with the largest diameter of 2.6 x 3.2      cm.  2. Previous CT with diameter of 3 cm.  3. Difficult to visualize due to increased bowel gas pattern.   ___________________________________________  Rosetta Posner, M.D.   NT/MEDQ  D:  12/17/2009  T:  12/17/2009  Job:  [id number]

## 2010-12-13 NOTE — Op Note (Signed)
Christopher Wilkerson, [place] NO.:  [id number]   MEDICAL RECORD NO.:  [id number]          PATIENT TYPE:  AMB   LOCATION:  [facility]                         FACILITY:  [facility]   PHYSICIAN:  Sigmund I. Gaynelle Arabian, M.D.DATE OF BIRTH:  05/06/1936   DATE OF PROCEDURE:  12/07/2008  DATE OF DISCHARGE:                               OPERATIVE REPORT   PREOPERATIVE DIAGNOSIS:  T-1C adenocarcinoma of the prostate.   POSTOPERATIVE DIAGNOSIS:  T-1C adenocarcinoma of the prostate.   OPERATIONS:  Cryotherapy of the prostate.   SURGEON:  Sigmund I. Gaynelle Arabian, M.D.   ANESTHESIA:  General LMA.   PREPARATION:  After appropriate preanesthesia, the patient is brought to  the operating room, placed on the operating room in the dorsal supine  position where general LMA anesthesia was introduced.  He was then  replaced in dorsal lithotomy position where the perineum was prepped  with Betadine solution and draped in the usual fashion.   Review of history:  Mr. Leva is a 75 year old male, with an  international prostate symptom score sheet of 18/7, despite medical  therapy.  Prostate size is 57 mL.  The patient's PSA was 5.5 with a PSA-  2 of 15.9%.  Biopsy showed Gleason 4 plus 3 carcinoma of the prostate in  the left apex, and 3 plus 4 equal 7 carcinoma of the prostate in the  left lateral apex and the left mid medial prostate.  In addition, the  patient had high-grade PIN in the right mid prostate, and atypical  tissue in the right base.  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  Metastatic survey was negative.  He is now for cryotherapy.   DESCRIPTION OF PROCEDURE:  With the patient in the lithotomy position,  the cryotherapy needles were placed with transrectal ultrasound  guidance.  Cystoscopy was accomplished, and showed no needles  within the  urethra or bladder.  A guidewire was placed, and the urethral warming  device was placed.  Following this, the patient underwent two  freeze/thaw cycles, without difficulty.  There was good protection of  the rectum.  The patient tolerated the procedure well.  He received IV  Toradol at the end of the procedure, and a B and O suppository.  Foley  catheter was placed after a 20-minute extra urethral warming was  accomplished.  The patient was taken to the recovery room in good  condition.      Sigmund I. Gaynelle Arabian, M.D.  Electronically Signed     SIT/MEDQ  D:  12/07/2008  T:  12/07/2008  Job:  [id number]

## 2010-12-16 NOTE — Discharge Summary (Signed)
Christopher Wilkerson, Christopher Wilkerson NO.:  [id number]   MEDICAL RECORD NO.:  [id number]          PATIENT TYPE:  INP   LOCATION:  2009                         FACILITY:  [facility]   PHYSICIAN:  Ivin Poot, M.D.  DATE OF BIRTH:  15-Apr-1936   DATE OF ADMISSION:  11/17/2005  DATE OF DISCHARGE:  11/25/2005                                 DISCHARGE SUMMARY   HISTORY OF PRESENT ILLNESS:  The patient is a 75 year old Caucasian male who  on the date of admission presented with an acute episode of left sided chest  pain.  The pain radiated to his left arm.  It was primarily sharp in nature  and approximately 3 on a scale of 1 to 10 in severity.  The patient  described a similar episode approximately two years ago when he was  evaluated at [facility] and no specific diagnosis was obtained  at that time.  He does have a history of emphysema as well as sleep apnea.  Additionally, he has a history of heavy smoking in the distant past,  however, he quit tobacco in 1976.  A chest x-ray was obtained in the  emergency department at [facility] and he was found to have a 70%  pneumothorax.  He also had an EKG which showed no evidence of ischemic  changes.  Due to these findings, Dr. Mollie Germany was consulted and  the patient was felt to require prompt placement of a chest tube and  admission for further evaluation and treatment.  Dr. Arlyce Dice placed the chest  tube and the patient was admitted.   PAST MEDICAL HISTORY:  1.  Emphysema.  2.  Hypertension.  3.  Benign prostatic hyperplasia.  4.  History of pneumonia x3, community-acquired.  5.  History of sleep apnea on chronic CPAP.   PAST SURGICAL HISTORY:  He had repair of a right index finger torn  collateral ligament by Dr. Fredna Dow.   ALLERGIES:  IODINE DYE.   MEDICATIONS:  Prior to admission:  1.  Cardura 8 mg daily.  2.  Lotrel 5/20 once daily.  3.  Vesicare 10 mg daily.  4.  An antiinflammatory, he is  uncertain of the name.   Family history, social history, review of symptoms, and physical exam,  please see the history and physical done at the time of admission.   HOSPITAL COURSE:  The patient was admitted through the emergency room, after  placement of a chest tube.  He continued to have a large air leak and was  monitored closely but it did not resolve.  During the hospital initial  evaluation, he was found to have an elevated creatinine and this was 2.2.  The patient was seen in consultation by the nephrology service and the  findings were felt to be significant primarily for hypertension and  nonsteroidal antiinflammatory use.  A full evaluation was undertaken.  He  has shown a gradual improvement and because of the longstanding air leak and  continued evidence of pneumothorax, he was felt to require surgery.  He was  seen by Dr.  Tharon Aquas Trigt who recommended a left video-assisted  thoracoscopy with resection of blebs as well as probable pleurodesis.  The  patient was then scheduled for the procedure.  It is noted the patient was  seen preoperatively by the pulmonologist.  He was deemed to be an acceptable  candidate for proceeding with surgery and it was scheduled.   PROCEDURE:  On November 21, 2005, the patient was taken to the operating room  where he underwent the following procedure, left video-assisted thoracoscopy  with resection of blebs and pleurodesis.  The patient tolerated the  procedure well and was taken to the post anesthesia care unit in stable  condition.   POSTOPERATIVE HOSPITAL COURSE:  The patient has done well.  His chest tubes  were able to be discontinued in the standard fashion.  He has no recurrence  of pneumothorax at time of dictation.  He will have a chest x-ray checked  one more time prior to discharge.  His chronic renal insufficiency is felt  to be improving after the discontinuation of all nonsteroidals and ACE  inhibitors.  He has been started on  a calcium channel blocker for his  hypertension.  He has been started on Spiriva by the pulmonologist.  His  overall status is felt to be stable and adequate for discharge on November 25, 2005, pending morning round re-evaluation.   INSTRUCTIONS:  The patient received written instructions regarding  medications, activity,diet, wound care, and followup.   FOLLOWUP:  1.  Dr. Tharon Aquas Trigt on Dec 08, 2005 at 2:30 with a chest x-ray from      [facility]  2.  Additionally, he is to see [facility] in six weeks.  3.  Follow up with his primary care physician in two weeks.   DISCHARGE MEDICATIONS:  He is to resume his pre-hospital medications with  the exception of Lotrel, and he is not to take nonsteroidal medications at  this time.  New medications include:  1.  Spiriva inhaler once daily.  2.  Norvasc 10 mg daily.  3.  Tylox 1-2 every four to six hours as needed for pain.   FINAL DIAGNOSES:  1.  Spontaneous left pneumothorax with large left upper lobe emphysematous      blebs, now status post resection of blebs and pleurodesis with good      clinical result.  2.  Chronic renal insufficiency with creatinine felt to be stabilized with      most recent 1.9 on November 23, 2005.  3.  Chronic obstructive pulmonary disease/emphysema.  4.  History of heavy tobacco use in the distant past, approximately 40-pack      years, quitting in 1976.  5.  Hypertension.  6.  Benign prostatic hyperplasia.  7.  History of previous pneumonia x2.  8.  History of sleep apnea.   CONDITION ON DISCHARGE:  Stable and improved.      Derrel Giovanni, P.A.-C.      Ivin Poot, M.D.  Electronically Signed    WEG/MEDQ  D:  11/24/2005  T:  11/25/2005  Job:  [id number]   cc:   Ivin Poot, M.D.  [address]  [place]  [place] [address]   Sharlet Salina, M.D.  Fax: [facility]

## 2010-12-16 NOTE — Op Note (Signed)
Christopher Wilkerson, [place] NO.:  [id number]   MEDICAL RECORD NO.:  [id number]          PATIENT TYPE:  INP   LOCATION:  2040                         FACILITY:  [facility]   PHYSICIAN:  Ivin Poot, M.D.  DATE OF BIRTH:  1935-08-06   DATE OF PROCEDURE:  11/21/2005  DATE OF DISCHARGE:                                 OPERATIVE REPORT   OPERATION:  Left video-assisted thorascopic surgery (VATS) with stapling,  excisional biopsy of left upper lobe blebs, pleurodesis, placement of On-Q  wound catheter irrigation system.   POSTOPERATIVE DIAGNOSIS:  Spontaneous left pneumothorax with large left  upper lobe blebs.   POSTOPERATIVE DIAGNOSIS:  Spontaneous left pneumothorax with large left  upper lobe blebs.   SURGEON:  Ivin Poot, M.D.   ASSISTANT:  Richardson Dopp, PA-C.   ANESTHESIA:  General.   INDICATIONS:  The patient is a 75 year old male who presented with acute  onset of shortness of breath and large 80-90% left pneumothorax.  The  patient has COPD and is an ex-smoker.  He recently had outpatient community-  acquired pneumonia treated with antibiotics.  A chest tube was placed by Dr.  Marlyn Corporal, which reexpanded the lung; however, there is a large air  leak.  The air leak did not diminish over the next 72 hours.  It was felt  that the patient would benefit from left VATS and stapling of blebs.  A CT  scan of the chest performed approximately six months ago when he had an  episode of upper respiratory infection/COPD flare-up showed left upper lobe  blebs.   Prior to surgery, I discussed the operation with the patient and family in  his room.  I discussed the alternatives to surgical therapy as well.  I  reviewed the nature and aspects of the planned procedure, including the use  of the video-assisted scope, the plan to excise the blebs in the upper lobe,  and the plan to do a pleurodesis and drainage with a new chest tube.  He  understood the associated  risks of MI, bleeding, prolonged air leak,  pneumonia, blood transfusion requirement, and death.  After reviewing these  issues, the patient demonstrated his understanding and agreed to proceed  with the operation as planned under what I felt was an informed consent.   OPERATIVE FINDINGS:  The patient had two large blebs in the upper lobe,  which were excised with the Endo-GIA staplers.  There were some adhesions in  the lateral aspect of the pleural space, which were divided to enhance  exposure of the entire lung.  At the end of the procedure, there was minimal  air leak from the chest tube.   PROCEDURE:  The patient was brought to the operating room and placed supine  on the operating room table.  General anesthesia was induced under invasive  hemodynamic monitoring.  A double lumen endotracheal tube was placed by the  anesthesiologist, and placement confirmed by bronchoscopy; however, during  the operation, there was suboptimal deflation of the lung, which required  some modification by the anesthesiologist.  The  patient's left chest was  prepped and draped as a sterile field.  Through the previously placed chest  tube, the scope was positioned and the hemithorax examined.  There was no  obvious tumor nodules or masses.  There were two blebs in the apical and  posterior aspects of the lung.  The VATS and portal incisions were then made  in the anterior axillary line at the 6th interspace and the mid axillary  line at approximately the 7th interspace and the posterior axillary line at  approximately the 5th interspace.  With the VATS camera and the endoscopic  instruments, the entire lung was examined.  There was no evidence of blebs  in the superior segment of the lower lobe.  There were two fairly large  blebs in the lateral and apical segment of the upper lobe.  Using the Endo-  GIA stapling devices, these two blebs were excised, and the staple lines  were examined and found to be  hemostatic.  A pleural abrasion of the apical  parietal pleura was performed using the cautery scratch pad.  A #36 chest  tube was placed through a clean incision and directed to the apex.  The VATS  portal incisions were then closed after the lung had been reexpanded.  The  patient was then extubated and returned to the recovery room in stable  condition.  There was minimal air leak from the chest tube at the end of the  procedure.      Ivin Poot, M.D.  Electronically Signed     PV/MEDQ  D:  11/21/2005  T:  11/22/2005  Job:  [id number]   cc:   [facility]

## 2010-12-16 NOTE — H&P (Signed)
NAMELANDERS, SUR NO.:  [id number]   MEDICAL RECORD NO.:  [id number]          PATIENT TYPE:  INP   LOCATION:  1843                         FACILITY:  [facility]   PHYSICIAN:  Nicanor Alcon, M.D. DATE OF BIRTH:  24-Aug-1935   DATE OF ADMISSION:  11/17/2005  DATE OF DISCHARGE:                                HISTORY & PHYSICAL   CHIEF COMPLAINT:  Chest pain.   HISTORY OF PRESENT ILLNESS:  The patient is a 75 year old Caucasian male who  at approximately 12:00 noon on today's date developed an episode of left-  sided chest pain.  The pain radiated to his left arm.  It was primarily  sharp in nature and 3/10 in severity.  The patient describes the pain as  similar to an episode approximately two years ago when he was evaluated at  [facility], and no specific diagnosis was obtained at that  time.  He does have a history of emphysema as well as sleep apnea.  He has a  history of heavy smoking in the distant past; however, he quit tobacco in  1996.  A chest x-ray was obtained at the emergency department at [facility],  and he was found to have a 70% pneumothorax.  He also had an EKG which  showed no evidence of ischemic changes.   Dr. Arlyce Dice was consulted, and he was felt to require chest tube placement as  well as admission for further evaluation and treatment.  The patient denies  cough, sputum production, or hemoptysis.  He does have dyspnea on exertion  on a chronic basis.  He denies recent fevers, chills, unexplained weight  loss, or reflux.  He does have a history of pneumonia on two occasions with  the most recent in 2005.  He denies a history of pulmonary embolus or DVT.  He denies peripheral edema, angina, palpitations, history of arrhythmias,  history of TIA, or a cerebrovascular accident.   PAST MEDICAL HISTORY:  1.  Emphysema.  2.  Hypertension.  3.  Benign prostatic hyperplasia.  4.  Pneumonia x2, as described above.  5.  History of  sleep apnea, on chronic CPAP.  The patient did have a stress      test approximately 18 months ago with no identified problems.   PAST SURGICAL HISTORY:  Right index finger torn collateral ligament by Dr.  Fredna Dow.   ALLERGIES:  IODINE DYE.   CURRENT MEDICATIONS:  1.  Cardura 8 mg daily.  2.  Lotrel 5/20 daily.  3.  Vesicare 10 mg daily.  4.  An anti-inflammatory, he is uncertain of the name.   REVIEW OF SYSTEMS:  See the history of present illness for pertinent  positives and negatives, otherwise he admits to recent left-sided back and  sciatic symptoms, otherwise unremarkable.   SOCIAL HISTORY:  He is married.  He smoked two packs per day of cigarettes  x20 years.  He quit in 1976.  Alcohol use is described as occasional.  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   FAMILY HISTORY:  Remarkable  for multiple family members with cancer.  His  father and brother deceased from heart disease.  Grandmother deceased from  sepsis related to a diabetic ulcer.   PHYSICAL EXAMINATION:  VITAL SIGNS:  Blood pressure 152/90, heart rate 59,  respirations 16, temperature 97.  Oxygen saturation 93% on room air.  GENERAL:  This is a 75 year old Caucasian male in no acute distress.  The  chest tube was just placed.  HEENT:  Normocephalic and atraumatic.  Pupils are equal, round and reactive  to light.  Extraocular movements are intact.  Oral mucosa is pink and moist.  Sclerae are anicteric.  Pharynx is clear without exudates or erythema.  Teeth appear in good condition.  NECK:  Supple.  No jugular venous distention.  He has palpable carotids.  No  lymphadenopathy.  PULMONARY:  Symmetrical on inspiration.  Currently unlabored.  There is a  wheeze present throughout the left-sided field, consistent with ongoing air  leak.  No rales or crackles.  CARDIAC:  Regular rate and rhythm.  No murmurs, rubs or gallops.  Normal S1  and S2.  ABDOMEN:  Soft, nontender,  nondistended.  Normoactive bowel sounds.  No  masses, bruits, or hepatosplenomegaly.  EXTREMITIES:  No edema.  No varicosities.  No venous stasis changes.  Peripheral edema are equal and intact bilaterally.  GENITOURINARY/RECTAL:  Deferred.  NEUROLOGIC:  Nonfocal.  Alert and oriented x4.  Gait is not tested.  Muscle  strength is grossly normal.  Cranial nerves II-XII grossly intact.   ASSESSMENT:  Spontaneous left pneumothorax in a patient with chronic  emphysema.  Other diagnoses include hypertension, benign prostatic  hyperplasia, history of pneumonia, history of sleep apnea, on chronic CPAP.   PLAN:  The plan is for admission chest tube per routine protocols.  Depending on the course, he may require video-assisted thoracostomy for  stapling of blebs.      Hai Giovanni, P.A.-C.    ______________________________  Nicanor Alcon, M.D.    Loren Racer  D:  11/17/2005  T:  11/17/2005  Job:  [id number]   cc:   Sharlet Salina, M.D.  Fax: [phone]

## 2010-12-16 NOTE — Op Note (Signed)
NAME:  Christopher Wilkerson, GARBACZ NO.:  [id number]   MEDICAL RECORD NO.:  [id number]                   PATIENT TYPE:  AMB   LOCATION:  [place]                                  FACILITY:  [facility]   PHYSICIAN:  Daryll Brod, M.D.                    DATE OF BIRTH:  Apr 04, 1936   DATE OF PROCEDURE:  10/07/2002  DATE OF DISCHARGE:                                 OPERATIVE REPORT   PREOPERATIVE DIAGNOSIS:  Ruptured radial collateral ligament  metacarpophalangeal joint, right index finger.   POSTOPERATIVE DIAGNOSIS:  Ruptured radial collateral ligament  metacarpophalangeal joint, right index finger.   OPERATION:  Repair radial collateral ligament metacarpophalangeal joint,  right index finger.   SURGEON:  Daryll Brod, M.D.   ASSISTANT:  Nurse specialist.   ANESTHESIA:  Forearm-based IV regional.   DATE OF OPERATION:  October 07, 2002.   HISTORY:  The patient is a 75 year old male with an injury to his right  index finger.  This has not responded to conservative treatment.  MRI  revealed a rupture of the radial collateral ligament.   PROCEDURE:  The patient was brought to the operating room where a forearm-  based IV regional anesthetic was carried out without difficulty.  He was  prepped and draped using Betadine scrubbing solution.  Right arm in supine  position.  After adequate anesthesia was established, a curvilinear incision  was made over the metacarpophalangeal joint based slightly to the radial  site, carried down through subcutaneous tissue.  Bleeders were  electrocauterized.  The neurovascular structures protected.  The dissection  carried down to deep sensor hood to the radial aspect and an incision was  made.  This allowed opening of the joint.  The joint was opened.  The radial  collateral ligament was found to be avulsed from the metacarpal neck.  This  had partially scarred.  The remainder of the joint was inspected.  Cartilage  was intact.  The area was  irrigated.  The area of origin of the collateral  ligament was equally identified.  This was roughened.  A Statak 2.5 mm  anchor was then positioned.  This was used to firmly affix the collateral  ligament back down on to the metacarpal neck.  This was done with the  metacarpophalangeal joint in flexion approximately 70 to 75 degrees, sutured  in to position.  The remainder of the capsule was closed after irrigation  with figure-of-eight 4-0 Vicryl sutures.  The suture hood was closed with  running 4-0 Mersilene suture.  The skin was closed with interrupted 5-0  Nylon sutures.  A sterile compressive dressing and splint were applied.  The  patient tolerated the procedure well.  The finger was placed through a full  range of motion prior to closure of the capsule.  We inspected the  collateral ligament and was found to be in good position  without any undue  stress.  The patient was taken to the recovery room for observation in  satisfactory condition.  He discharged home, to return to the hand center in  [place] in one week, on Vicodin and Keflex.                                              Daryll Brod, M.D.     Aretta Nip  D:  10/07/2002  T:  10/07/2002  Job:  [id number]

## 2010-12-16 NOTE — H&P (Signed)
NAMEDAULTON, BRITTS NO.:  [id number]   MEDICAL RECORD NO.:  [id number]          PATIENT TYPE:  INP   LOCATION:  2040                         FACILITY:  [facility]   PHYSICIAN:  Starleen Blue, M.D.DATE OF BIRTH:  [birth year]   DATE OF ADMISSION:  11/17/2005  DATE OF DISCHARGE:                                HISTORY & PHYSICAL   _______________   Chest x-ray; there was no evidence per laboratory and EKG ____________.  ______________.  CARDIOVASCULAR:  Regular rate and rhythm, no JVD.  LUNGS:  Decreased breath sounds left lower lung base.  Chest tube in place  left mid flank.  Right lung clear to auscultation.  ABDOMEN:  Soft, nontender, and nondistended.  Positive bowel sounds.  EXTREMITIES:  No lower extremity edema.  5/5 muscle strength upper and lower  extremities.  NEUROLOGY:  Cranial nerves II-XII grossly intact.   LABORATORY DATA:  Sodium 140, potassium 4.2, chloride 106, bicarb 27, BUN  18, creatinine 2.2, glucose 107, calcium 8.9, WBC 7.9.  Hemoglobin 14.6,  hematocrit 41.3, platelets 181.   ASSESSMENT:  1.  Renal insufficiency, estimated BFR at 35/HC renal disease likely      secondary to dehydration, most likely secondary to hypertension      nephropathy versus NSAID use.  We will get renal ultrasound for anatomy,      urinalysis with microscopy and gram stain.  Agree with discontinuing      Lotrel prior to surgery and 48 hour postoperative.  Discontinue Motrin.      Encourage IV fluids.  We will suggest delaying surgery until renal      status is improved.  2.  Pneumothorax per CVTS planning for OR on April 23, for resection of bleb      and pleurodesis, however, we will recommend delaying surgery until renal      status improves.  3.  Hypertension.  Blood pressures are elevated since discontinuing Lotrel,      however, we will monitor for now.  4.  Benign prostatic hypertrophy, continue Doxazosin.  5.  Obstructive sleep apnea, continue  CPAP q.h.s.      Starleen Blue, M.D.     VRE/MEDQ  D:  11/19/2005  T:  11/19/2005  Job:  [id number]   cc:   Sharlet Salina, M.D.  [phone]   [facility]   Nicanor Alcon, M.D.  [address]  [place]  [place] [address]

## 2010-12-23 ENCOUNTER — Encounter (INDEPENDENT_AMBULATORY_CARE_PROVIDER_SITE_OTHER): Payer: Medicare Other

## 2010-12-23 DIAGNOSIS — I714 Abdominal aortic aneurysm, without rupture, unspecified: Secondary | ICD-10-CM

## 2011-01-04 NOTE — Procedures (Unsigned)
DUPLEX ULTRASOUND OF ABDOMINAL AORTA  INDICATION:  Followup abdominal aortic aneurysm.  HISTORY: Diabetes:  No. Cardiac:  No. Hypertension:  Yes. Smoking:  Previous. Connective Tissue Disorder: Family History:  No. Previous Surgery:  No.  DUPLEX EXAM:         AP (cm)                   TRANSVERSE (cm) Proximal             2.31 cm                   2.47 cm Mid                  3.0 cm                    3.0 cm Distal               2.15 cm                   2.22 cm Right Iliac          1.70 cm                   1.52 cm Left Iliac           1.87 cm                   2.25 cm  PREVIOUS:  Date:  12/17/2009  AP:  3.1  TRANSVERSE:  2.8  IMPRESSION: 1. Infrarenal abdominal aortic aneurysm present measuring     approximately 3.0 cm x 3.0 cm. 2. Left common iliac artery aneurysm in the mid/ distal segment     measuring 1.87 cm x 2.25 cm. 3. Abdominal aortic aneurysm is essentially unchanged from previous     study on 12/17/2009. 4. New finding of left common iliac artery aneurysm in the mid distal     segment present since previous study on 12/17/2009.  ___________________________________________ Rosetta Posner, M.D.  SH/MEDQ  D:  12/23/2010  T:  12/23/2010  Job:  [id number]

## 2011-05-04 ENCOUNTER — Encounter: Payer: Self-pay | Admitting: Pulmonary Disease

## 2011-05-10 ENCOUNTER — Ambulatory Visit (INDEPENDENT_AMBULATORY_CARE_PROVIDER_SITE_OTHER): Payer: Medicare Other | Admitting: Pulmonary Disease

## 2011-05-10 ENCOUNTER — Encounter: Payer: Self-pay | Admitting: Pulmonary Disease

## 2011-05-10 DIAGNOSIS — J449 Chronic obstructive pulmonary disease, unspecified: Secondary | ICD-10-CM

## 2011-05-10 DIAGNOSIS — Z23 Encounter for immunization: Secondary | ICD-10-CM

## 2011-05-10 DIAGNOSIS — G4733 Obstructive sleep apnea (adult) (pediatric): Secondary | ICD-10-CM

## 2011-05-10 NOTE — Patient Instructions (Signed)
Hold spiriva for now, and try dulera 100/5  2 inhalations am and pm for next 4 weeks.  Rinse mouth well.  Please call me in 4 weeks to let me know if you do better on this Will give you a flu shot today. Stay on cpap, work on weight loss followup with me in one year.

## 2011-05-10 NOTE — Assessment & Plan Note (Signed)
The patient has mild obstructive disease by PFTs, and has been on Spiriva since the last visit.  Despite this, he continues to describe a day-to-day variability in his exertional tolerance level.  This raises the question whether he may have asthma rather than emphysema?  He has been on a LABA/ICS in the past, but I would like to try this again to see if he notices a difference.  If he does not, I would maintain him on Spiriva alone.

## 2011-05-10 NOTE — Assessment & Plan Note (Signed)
The patient is doing well from a sleep apnea standpoint.  He is wearing CPAP without issues, and feels that he is sleeping well with the device.  I have encouraged him to work on weight loss.

## 2011-05-10 NOTE — Progress Notes (Signed)
  Subjective:    Patient ID: Christopher Wilkerson, male    DOB: 27-Oct-1935, 75 y.o.   MRN: [id number]  HPI The patient comes in today for followup of his known COPD and sleep apnea.  He is wearing CPAP compliantly, and is having no issues with his mask or pressure.  He also feels that his breathing is stable, and is remaining very functional.  He still describes a day-to-day variability in his exertional tolerance, and this raises the question of whether his airflow obstruction may be due to asthma rather than emphysema.   Review of Systems  Constitutional: Negative for fever and unexpected weight change.  HENT: Negative for ear pain, nosebleeds, congestion, sore throat, rhinorrhea, sneezing, trouble swallowing, dental problem, postnasal drip and sinus pressure.   Eyes: Negative for redness and itching.  Respiratory: Negative for cough, chest tightness, shortness of breath and wheezing.   Cardiovascular: Negative for palpitations and leg swelling.  Gastrointestinal: Negative for nausea and vomiting.  Genitourinary: Negative for dysuria.  Musculoskeletal: Negative for joint swelling.  Skin: Negative for rash.  Neurological: Negative for headaches.  Hematological: Bruises/bleeds easily.  Psychiatric/Behavioral: Negative for dysphoric mood. The patient is not nervous/anxious.        Objective:   Physical Exam Well-developed male in no acute distress No skin breakdown or pressure necrosis from the CPAP mask Chest clear to auscultation, no wheezes noted Cardiac with regular rate and rhythm Lower extremities without edema, no cyanosis noted Alert and oriented, does not appear sleepy, moves all 4 extremities.       Assessment & Plan:

## 2011-06-01 ENCOUNTER — Telehealth: Payer: Self-pay | Admitting: Pulmonary Disease

## 2011-06-01 NOTE — Telephone Encounter (Signed)
Spoke with pt. He states that when he plugged his CPAP machine in last night it did not work, however, this am he plugged it in and "it works like a Nutritional therapist"- nothing further is needed per pt.

## 2011-06-08 ENCOUNTER — Telehealth: Payer: Self-pay | Admitting: Pulmonary Disease

## 2011-06-08 NOTE — Telephone Encounter (Signed)
Per KC's last OV (05-10-11) pt to try off Spiriva and start Dulera 4 weeks and report back.  Pt is requesting to try another month of Dulera before making a decision about changing. States does not feel any difference from Spiriva. I advised pt [place] will be back in the office in the morning and pt is okay with that. Dr. Gwenette Greet please advise, thanks.

## 2011-06-09 MED ORDER — MOMETASONE FURO-FORMOTEROL FUM 100-5 MCG/ACT IN AERO: 2.0000 | INHALATION_SPRAY | Freq: Two times a day (BID) | RESPIRATORY_TRACT | Status: DC

## 2011-06-09 NOTE — Telephone Encounter (Signed)
I am ok with that.

## 2011-06-09 NOTE — Telephone Encounter (Signed)
Called and spoke with pt and he stated that he wanted to try the dulera for another month.  Per [place]  This is ok and pt should call us back in 4 wks to report which med works better for him.  dulera 100-5 has been sent in to the pts pharmacy for 1 mont supply and pt is aware.

## 2011-07-05 ENCOUNTER — Other Ambulatory Visit (HOSPITAL_COMMUNITY): Payer: Self-pay | Admitting: Orthopedic Surgery

## 2011-07-05 DIAGNOSIS — C61 Malignant neoplasm of prostate: Secondary | ICD-10-CM

## 2011-07-13 ENCOUNTER — Encounter (HOSPITAL_COMMUNITY)
Admission: RE | Admit: 2011-07-13 | Discharge: 2011-07-13 | Disposition: A | Discharge status: Home / Self Care | Payer: Medicare Other | Source: Ambulatory Visit | Attending: Orthopedic Surgery | Admitting: Orthopedic Surgery

## 2011-07-13 DIAGNOSIS — M25559 Pain in unspecified hip: Secondary | ICD-10-CM | POA: Insufficient documentation

## 2011-07-13 DIAGNOSIS — C61 Malignant neoplasm of prostate: Secondary | ICD-10-CM | POA: Insufficient documentation

## 2011-07-13 DIAGNOSIS — M549 Dorsalgia, unspecified: Secondary | ICD-10-CM | POA: Insufficient documentation

## 2011-07-19 ENCOUNTER — Encounter (HOSPITAL_COMMUNITY): Payer: Medicare Other

## 2011-07-19 ENCOUNTER — Ambulatory Visit (HOSPITAL_COMMUNITY): Payer: Medicare Other

## 2011-07-22 ENCOUNTER — Other Ambulatory Visit: Payer: Self-pay | Admitting: Pulmonary Disease

## 2011-07-27 ENCOUNTER — Other Ambulatory Visit: Payer: Self-pay | Admitting: Orthopedic Surgery

## 2011-07-27 DIAGNOSIS — M533 Sacrococcygeal disorders, not elsewhere classified: Secondary | ICD-10-CM

## 2011-07-31 ENCOUNTER — Ambulatory Visit
Admission: RE | Admit: 2011-07-31 | Discharge: 2011-07-31 | Disposition: A | Discharge status: Home / Self Care | Payer: Medicare Other | Source: Ambulatory Visit | Attending: Orthopedic Surgery | Admitting: Orthopedic Surgery

## 2011-07-31 DIAGNOSIS — M533 Sacrococcygeal disorders, not elsewhere classified: Secondary | ICD-10-CM

## 2011-08-02 DIAGNOSIS — M545 Low back pain, unspecified: Secondary | ICD-10-CM | POA: Diagnosis not present

## 2011-08-02 DIAGNOSIS — M722 Plantar fascial fibromatosis: Secondary | ICD-10-CM | POA: Diagnosis not present

## 2011-08-07 DIAGNOSIS — M722 Plantar fascial fibromatosis: Secondary | ICD-10-CM | POA: Diagnosis not present

## 2011-08-08 DIAGNOSIS — D480 Neoplasm of uncertain behavior of bone and articular cartilage: Secondary | ICD-10-CM | POA: Diagnosis not present

## 2011-08-09 DIAGNOSIS — M722 Plantar fascial fibromatosis: Secondary | ICD-10-CM | POA: Diagnosis not present

## 2011-09-22 DIAGNOSIS — M549 Dorsalgia, unspecified: Secondary | ICD-10-CM | POA: Diagnosis not present

## 2011-09-22 DIAGNOSIS — M5380 Other specified dorsopathies, site unspecified: Secondary | ICD-10-CM | POA: Diagnosis not present

## 2011-10-13 DIAGNOSIS — J309 Allergic rhinitis, unspecified: Secondary | ICD-10-CM | POA: Diagnosis not present

## 2011-10-13 DIAGNOSIS — M545 Low back pain, unspecified: Secondary | ICD-10-CM | POA: Diagnosis not present

## 2011-10-13 DIAGNOSIS — J449 Chronic obstructive pulmonary disease, unspecified: Secondary | ICD-10-CM | POA: Diagnosis not present

## 2011-10-13 DIAGNOSIS — Z0000 Encounter for general adult medical examination without abnormal findings: Secondary | ICD-10-CM | POA: Diagnosis not present

## 2011-10-13 DIAGNOSIS — E782 Mixed hyperlipidemia: Secondary | ICD-10-CM | POA: Diagnosis not present

## 2011-10-13 DIAGNOSIS — I10 Essential (primary) hypertension: Secondary | ICD-10-CM | POA: Diagnosis not present

## 2011-10-13 DIAGNOSIS — N183 Chronic kidney disease, stage 3 unspecified: Secondary | ICD-10-CM | POA: Diagnosis not present

## 2011-10-19 DIAGNOSIS — D480 Neoplasm of uncertain behavior of bone and articular cartilage: Secondary | ICD-10-CM | POA: Diagnosis not present

## 2011-10-19 DIAGNOSIS — M5137 Other intervertebral disc degeneration, lumbosacral region: Secondary | ICD-10-CM | POA: Diagnosis not present

## 2011-10-19 DIAGNOSIS — M47817 Spondylosis without myelopathy or radiculopathy, lumbosacral region: Secondary | ICD-10-CM | POA: Diagnosis not present

## 2011-10-19 DIAGNOSIS — I723 Aneurysm of iliac artery: Secondary | ICD-10-CM | POA: Diagnosis not present

## 2011-11-01 DIAGNOSIS — M545 Low back pain, unspecified: Secondary | ICD-10-CM | POA: Diagnosis not present

## 2011-11-03 DIAGNOSIS — M545 Low back pain, unspecified: Secondary | ICD-10-CM | POA: Diagnosis not present

## 2011-11-08 DIAGNOSIS — M545 Low back pain, unspecified: Secondary | ICD-10-CM | POA: Diagnosis not present

## 2011-11-10 DIAGNOSIS — M545 Low back pain, unspecified: Secondary | ICD-10-CM | POA: Diagnosis not present

## 2011-11-13 DIAGNOSIS — M545 Low back pain, unspecified: Secondary | ICD-10-CM | POA: Diagnosis not present

## 2011-11-13 DIAGNOSIS — C61 Malignant neoplasm of prostate: Secondary | ICD-10-CM | POA: Diagnosis not present

## 2011-11-13 DIAGNOSIS — R3915 Urgency of urination: Secondary | ICD-10-CM | POA: Diagnosis not present

## 2011-11-15 DIAGNOSIS — M545 Low back pain, unspecified: Secondary | ICD-10-CM | POA: Diagnosis not present

## 2011-11-17 DIAGNOSIS — M545 Low back pain, unspecified: Secondary | ICD-10-CM | POA: Diagnosis not present

## 2011-11-20 DIAGNOSIS — M545 Low back pain, unspecified: Secondary | ICD-10-CM | POA: Diagnosis not present

## 2011-11-22 DIAGNOSIS — M545 Low back pain, unspecified: Secondary | ICD-10-CM | POA: Diagnosis not present

## 2011-11-24 DIAGNOSIS — M545 Low back pain, unspecified: Secondary | ICD-10-CM | POA: Diagnosis not present

## 2011-11-28 DIAGNOSIS — M545 Low back pain, unspecified: Secondary | ICD-10-CM | POA: Diagnosis not present

## 2011-11-29 DIAGNOSIS — M545 Low back pain, unspecified: Secondary | ICD-10-CM | POA: Diagnosis not present

## 2011-12-13 DIAGNOSIS — R972 Elevated prostate specific antigen [PSA]: Secondary | ICD-10-CM | POA: Diagnosis not present

## 2011-12-13 DIAGNOSIS — C61 Malignant neoplasm of prostate: Secondary | ICD-10-CM | POA: Diagnosis not present

## 2011-12-18 DIAGNOSIS — R972 Elevated prostate specific antigen [PSA]: Secondary | ICD-10-CM | POA: Diagnosis not present

## 2011-12-18 DIAGNOSIS — C61 Malignant neoplasm of prostate: Secondary | ICD-10-CM | POA: Diagnosis not present

## 2012-01-23 DIAGNOSIS — D235 Other benign neoplasm of skin of trunk: Secondary | ICD-10-CM | POA: Diagnosis not present

## 2012-01-23 DIAGNOSIS — L578 Other skin changes due to chronic exposure to nonionizing radiation: Secondary | ICD-10-CM | POA: Diagnosis not present

## 2012-01-23 DIAGNOSIS — L905 Scar conditions and fibrosis of skin: Secondary | ICD-10-CM | POA: Diagnosis not present

## 2012-01-23 DIAGNOSIS — L570 Actinic keratosis: Secondary | ICD-10-CM | POA: Diagnosis not present

## 2012-02-09 DIAGNOSIS — H2510 Age-related nuclear cataract, unspecified eye: Secondary | ICD-10-CM | POA: Diagnosis not present

## 2012-02-09 DIAGNOSIS — H5200 Hypermetropia, unspecified eye: Secondary | ICD-10-CM | POA: Diagnosis not present

## 2012-02-09 DIAGNOSIS — H524 Presbyopia: Secondary | ICD-10-CM | POA: Diagnosis not present

## 2012-03-10 DIAGNOSIS — H1030 Unspecified acute conjunctivitis, unspecified eye: Secondary | ICD-10-CM | POA: Diagnosis not present

## 2012-03-10 DIAGNOSIS — J441 Chronic obstructive pulmonary disease with (acute) exacerbation: Secondary | ICD-10-CM | POA: Diagnosis not present

## 2012-03-10 DIAGNOSIS — J018 Other acute sinusitis: Secondary | ICD-10-CM | POA: Diagnosis not present

## 2012-03-18 DIAGNOSIS — J069 Acute upper respiratory infection, unspecified: Secondary | ICD-10-CM | POA: Diagnosis not present

## 2012-04-23 ENCOUNTER — Ambulatory Visit: Payer: Medicare Other | Admitting: Adult Health

## 2012-04-23 DIAGNOSIS — M25559 Pain in unspecified hip: Secondary | ICD-10-CM | POA: Diagnosis not present

## 2012-04-23 DIAGNOSIS — R110 Nausea: Secondary | ICD-10-CM | POA: Diagnosis not present

## 2012-04-23 DIAGNOSIS — M545 Low back pain, unspecified: Secondary | ICD-10-CM | POA: Diagnosis not present

## 2012-04-23 DIAGNOSIS — R6883 Chills (without fever): Secondary | ICD-10-CM | POA: Diagnosis not present

## 2012-04-23 DIAGNOSIS — R109 Unspecified abdominal pain: Secondary | ICD-10-CM | POA: Diagnosis not present

## 2012-04-24 ENCOUNTER — Encounter: Payer: Self-pay | Admitting: Pulmonary Disease

## 2012-04-24 ENCOUNTER — Ambulatory Visit: Payer: Medicare Other | Admitting: Pulmonary Disease

## 2012-04-24 ENCOUNTER — Ambulatory Visit (INDEPENDENT_AMBULATORY_CARE_PROVIDER_SITE_OTHER): Payer: Medicare Other | Admitting: Pulmonary Disease

## 2012-04-24 ENCOUNTER — Ambulatory Visit (INDEPENDENT_AMBULATORY_CARE_PROVIDER_SITE_OTHER)
Admission: RE | Admit: 2012-04-24 | Discharge: 2012-04-24 | Disposition: A | Discharge status: Home / Self Care | Payer: Medicare Other | Source: Ambulatory Visit | Attending: Pulmonary Disease | Admitting: Pulmonary Disease

## 2012-04-24 VITALS — BP 138/82 | HR 50 | Temp 97.6°F | Ht 71.0 in | Wt 209.6 lb

## 2012-04-24 DIAGNOSIS — J449 Chronic obstructive pulmonary disease, unspecified: Secondary | ICD-10-CM

## 2012-04-24 DIAGNOSIS — Z23 Encounter for immunization: Secondary | ICD-10-CM

## 2012-04-24 MED ORDER — PREDNISONE 10 MG PO TABS: ORAL_TABLET | ORAL | Status: DC

## 2012-04-24 NOTE — Patient Instructions (Addendum)
Will give you a flu shot today Stay on dulera Your dry cough is more than likely related to your blood pressure medication.  Can talk with primary md about trying a different medication. Will check cxr today. Will treat with a short course of prednisone to see if your breathing improves.  Please call me in 2 weeks with update.

## 2012-04-24 NOTE — Progress Notes (Signed)
  Subjective:    Patient ID: Christopher Wilkerson, male    DOB: 1935-11-22, 76 y.o.   MRN: [id number]  HPI The patient comes in today for an acute sick visit.  He has known COPD, and was changed from [place] to [place] at the last visit.  He hasn't seen a difference in symptoms from one inhaler to the other.  He felt like his breathing was doing well until approximately 2 months ago, when he has noticed progressive dyspnea on exertion.  It has gotten to the point that he cannot do yard work, and he may get winded just walking from his house.  He has not had worsening lower extremity edema, and denies chest congestion or purulent mucus.  He has had a very mild dry hacking cough, but is on an ACE inhibitor.  He has not had a recent chest x-ray.   Review of Systems  Constitutional: Negative for fever and unexpected weight change.  HENT: Positive for sinus pressure. Negative for ear pain, nosebleeds, congestion, sore throat, rhinorrhea, sneezing, trouble swallowing, dental problem and postnasal drip.   Eyes: Negative for redness and itching.  Respiratory: Positive for cough and shortness of breath. Negative for chest tightness and wheezing.   Cardiovascular: Negative for palpitations and leg swelling.  Gastrointestinal: Negative for nausea and vomiting.  Genitourinary: Negative for dysuria.  Musculoskeletal: Negative for joint swelling.  Skin: Negative for rash.  Neurological: Negative for headaches.  Hematological: Does not bruise/bleed easily.  Psychiatric/Behavioral: Negative for dysphoric mood. The patient is not nervous/anxious.        Objective:   Physical Exam Overweight male in no acute distress Nose without purulence or discharge noted Neck without lymphadenopathy or thyromegaly Chest with very faint crackles right base, otherwise totally clear.  No wheezing noted Cardiac exam with regular rate and rhythm Lower extremities with very mild ankle edema, no cyanosis Alert and oriented, moves  all 4 extremities.       Assessment & Plan:

## 2012-04-24 NOTE — Assessment & Plan Note (Signed)
The patient has a history of mild COPD, but is having progressive dyspnea on exertion over the last few months.  He does not have any wheezing on exam, and appears to have adequate air flow.  It is unclear whether he is having an acute exacerbation of his obstructive lung disease, or whether this may be something else entirely.  I would like to check a chest x-ray today, and we'll also treat him emperically with a course of steroids to see if he improves.  If he does not, he may need a cardiac evaluation.  I have also told him that if his cough is bothering him enough, he should talk with his primary physician about changing his blood pressure medication.

## 2012-04-26 NOTE — Progress Notes (Signed)
Quick Note:  Called, spoke with pt. Informed him cxr is clear per [facility]. He verbalized understanding of this and voiced no further questions/concerns at this time. ______

## 2012-05-21 DIAGNOSIS — L570 Actinic keratosis: Secondary | ICD-10-CM | POA: Diagnosis not present

## 2012-06-03 ENCOUNTER — Encounter: Payer: Self-pay | Admitting: Vascular Surgery

## 2012-06-06 ENCOUNTER — Other Ambulatory Visit: Payer: Self-pay | Admitting: *Deleted

## 2012-06-06 ENCOUNTER — Ambulatory Visit (INDEPENDENT_AMBULATORY_CARE_PROVIDER_SITE_OTHER): Payer: Medicare Other | Admitting: Neurosurgery

## 2012-06-06 ENCOUNTER — Encounter (INDEPENDENT_AMBULATORY_CARE_PROVIDER_SITE_OTHER): Payer: Medicare Other | Admitting: *Deleted

## 2012-06-06 ENCOUNTER — Encounter: Payer: Self-pay | Admitting: Neurosurgery

## 2012-06-06 VITALS — BP 154/73 | HR 46 | Resp 14 | Ht 71.0 in | Wt 207.0 lb

## 2012-06-06 DIAGNOSIS — I714 Abdominal aortic aneurysm, without rupture, unspecified: Secondary | ICD-10-CM

## 2012-06-06 NOTE — Addendum Note (Signed)
Addended by: Mena Goes on: 06/06/2012 03:58 PM   Modules accepted: Orders

## 2012-06-06 NOTE — Progress Notes (Signed)
[facility] AAA/PAD/PVD Office Note  CC: AAA surveillance Referring Physician: Early  History of Present Illness: 76 year old male patient of Dr. Donnetta Hutching is followed for known AAA. The patient denies any unusual abdominal or back pain, new medical diagnoses or recent surgery.  Past Medical History  Diagnosis Date  . COPD (chronic obstructive pulmonary disease)   . Prostate cancer   . OSA (obstructive sleep apnea)   . Hypertension     ROS: [x]  Positive   [ ]  Denies    General: [ ]  Weight loss, [ ]  Fever, [ ]  chills Neurologic: [ ]  Dizziness, [ ]  Blackouts, [ ]  Seizure [ ]  Stroke, [ ]  "Mini stroke", [ ]  Slurred speech, [ ]  Temporary blindness; [ ]  weakness in arms or legs, [ ]  Hoarseness Cardiac: [ ]  Chest pain/pressure, [ ]  Shortness of breath at rest [ ]  Shortness of breath with exertion, [ ]  Atrial fibrillation or irregular heartbeat Vascular: [ ]  Pain in legs with walking, [ ]  Pain in legs at rest, [ ]  Pain in legs at night,  [ ]  Non-healing ulcer, [ ]  Blood clot in vein/DVT,   Pulmonary: [ ]  Home oxygen, [ ]  Productive cough, [ ]  Coughing up blood, [ ]  Asthma,  [ ]  Wheezing Musculoskeletal:  [ ]  Arthritis, [ ]  Low back pain, [ ]  Joint pain Hematologic: [ ]  Easy Bruising, [ ]  Anemia; [ ]  Hepatitis Gastrointestinal: [ ]  Blood in stool, [ ]  Gastroesophageal Reflux/heartburn, [ ]  Trouble swallowing Urinary: [ ]  chronic Kidney disease, [ ]  on HD - [ ]  MWF or [ ]  TTHS, [ ]  Burning with urination, [ ]  Difficulty urinating Skin: [ ]  Rashes, [ ]  Wounds Psychological: [ ]  Anxiety, [ ]  Depression   Social History History  Substance Use Topics  . Smoking status: Former Smoker -- 2.0 packs/day    Quit date: 01/29/1975  . Smokeless tobacco: Not on file  . Alcohol Use: Yes    Family History Family History  Problem Relation Age of Onset  . Heart disease Father     Heart Disease before age 76  . Hyperlipidemia Father   . Hypertension Father   . Heart  attack Father   . Diabetes Brother   . Heart disease Brother     Heart Disease before age 32  . Hyperlipidemia Brother   . Hypertension Brother   . Heart attack Brother   . Ovarian cancer Mother   . Lung cancer Mother   . Cancer Mother     Lung Cancer  . Diabetes Mother   . Hyperlipidemia Mother   . Hypertension Mother   . Colon cancer Maternal Grandfather     Allergies  Allergen Reactions  . Iohexol     Current Outpatient Prescriptions  Medication Sig Dispense Refill  . albuterol (PROVENTIL HFA) 108 (90 BASE) MCG/ACT inhaler Inhale 2 puffs into the lungs every 4 (four) hours as needed.        Marland Kitchen amLODipine (NORVASC) 10 MG tablet Take by mouth Daily.      Marland Kitchen aspirin 81 MG tablet Take 81 mg by mouth daily.        . benazepril (LOTENSIN) 10 MG tablet Take 10 mg by mouth daily.      Marland Kitchen doxazosin (CARDURA) 8 MG tablet Take 8 mg by mouth at bedtime.        . DULERA 100-5 MCG/ACT AERO 2 PUFFS 2 TIMES A DAY  13 g  3  . finasteride (PROSCAR)  5 MG tablet Take 5 mg by mouth daily.        . predniSONE (DELTASONE) 10 MG tablet Take 4 daily x 3 days, 3 daily x 3 days, 2 daily x 3 days, 1 daily x 3 days then sotp  30 tablet  0  . solifenacin (VESICARE) 10 MG tablet Take 10 mg by mouth daily.      . VESICARE 10 MG tablet         Physical Examination  Filed Vitals:   06/06/12 1054  BP: 154/73  Pulse: 46  Resp: 14    Body mass index is 28.87 kg/(m^2).  General:  WDWN in NAD Gait: Normal HEENT: WNL Eyes: Pupils equal Pulmonary: normal non-labored breathing , without Rales, rhonchi,  wheezing Cardiac: RRR, without  Murmurs, rubs or gallops; No carotid bruits Abdomen: soft, NT, no masses Skin: no rashes, ulcers noted Vascular Exam/Pulses: 2+ radial pulses bilaterally, femoral pulses are palpable, there is no abdominal mass palpated  Extremities without ischemic changes, no Gangrene , no cellulitis; no open wounds;  Musculoskeletal: no muscle wasting or atrophy  Neurologic: A&O X  3; Appropriate Affect ; SENSATION: normal; MOTOR FUNCTION:  moving all extremities equally. Speech is fluent/normal  Non-Invasive Vascular Imaging: Maximum AAA diameter today is 3.4 AP by 3.1 transverse, previous exam May 2012 he was 3.0  ASSESSMENT/PLAN: Asymptomatic AAA that'll followup in one year with AAA duplex. The patient's questions were encouraged and answered, he is in agreement with this plan.  Beatris Ship ANP  Clinic M.D.: Early

## 2012-06-19 DIAGNOSIS — R972 Elevated prostate specific antigen [PSA]: Secondary | ICD-10-CM | POA: Diagnosis not present

## 2012-06-25 DIAGNOSIS — M25559 Pain in unspecified hip: Secondary | ICD-10-CM | POA: Diagnosis not present

## 2012-06-25 DIAGNOSIS — M948X9 Other specified disorders of cartilage, unspecified sites: Secondary | ICD-10-CM | POA: Diagnosis not present

## 2012-06-25 DIAGNOSIS — D480 Neoplasm of uncertain behavior of bone and articular cartilage: Secondary | ICD-10-CM | POA: Diagnosis not present

## 2012-07-02 ENCOUNTER — Other Ambulatory Visit: Payer: Self-pay | Admitting: Dermatology

## 2012-07-02 DIAGNOSIS — L570 Actinic keratosis: Secondary | ICD-10-CM | POA: Diagnosis not present

## 2012-07-02 DIAGNOSIS — C44211 Basal cell carcinoma of skin of unspecified ear and external auricular canal: Secondary | ICD-10-CM | POA: Diagnosis not present

## 2012-08-01 DIAGNOSIS — R071 Chest pain on breathing: Secondary | ICD-10-CM | POA: Diagnosis not present

## 2012-08-01 DIAGNOSIS — R059 Cough, unspecified: Secondary | ICD-10-CM | POA: Diagnosis not present

## 2012-08-01 DIAGNOSIS — J988 Other specified respiratory disorders: Secondary | ICD-10-CM | POA: Diagnosis not present

## 2012-08-01 DIAGNOSIS — R05 Cough: Secondary | ICD-10-CM | POA: Diagnosis not present

## 2012-08-02 DIAGNOSIS — R6889 Other general symptoms and signs: Secondary | ICD-10-CM | POA: Diagnosis not present

## 2012-08-02 DIAGNOSIS — R5381 Other malaise: Secondary | ICD-10-CM | POA: Diagnosis not present

## 2012-08-02 DIAGNOSIS — R509 Fever, unspecified: Secondary | ICD-10-CM | POA: Diagnosis not present

## 2012-08-02 DIAGNOSIS — N281 Cyst of kidney, acquired: Secondary | ICD-10-CM | POA: Diagnosis not present

## 2012-08-02 DIAGNOSIS — R0609 Other forms of dyspnea: Secondary | ICD-10-CM | POA: Diagnosis not present

## 2012-08-02 DIAGNOSIS — R079 Chest pain, unspecified: Secondary | ICD-10-CM | POA: Diagnosis not present

## 2012-08-02 DIAGNOSIS — K5730 Diverticulosis of large intestine without perforation or abscess without bleeding: Secondary | ICD-10-CM | POA: Diagnosis not present

## 2012-08-02 DIAGNOSIS — R05 Cough: Secondary | ICD-10-CM | POA: Diagnosis not present

## 2012-08-02 DIAGNOSIS — R059 Cough, unspecified: Secondary | ICD-10-CM | POA: Diagnosis not present

## 2012-08-02 DIAGNOSIS — J159 Unspecified bacterial pneumonia: Secondary | ICD-10-CM | POA: Diagnosis not present

## 2012-08-02 DIAGNOSIS — J90 Pleural effusion, not elsewhere classified: Secondary | ICD-10-CM | POA: Diagnosis not present

## 2012-08-02 DIAGNOSIS — R5383 Other fatigue: Secondary | ICD-10-CM | POA: Diagnosis not present

## 2012-08-02 DIAGNOSIS — R109 Unspecified abdominal pain: Secondary | ICD-10-CM | POA: Diagnosis not present

## 2012-08-02 DIAGNOSIS — N19 Unspecified kidney failure: Secondary | ICD-10-CM | POA: Diagnosis not present

## 2012-08-02 DIAGNOSIS — R0989 Other specified symptoms and signs involving the circulatory and respiratory systems: Secondary | ICD-10-CM | POA: Diagnosis not present

## 2012-08-03 DIAGNOSIS — N183 Chronic kidney disease, stage 3 unspecified: Secondary | ICD-10-CM | POA: Diagnosis not present

## 2012-08-03 DIAGNOSIS — R509 Fever, unspecified: Secondary | ICD-10-CM | POA: Diagnosis not present

## 2012-08-03 DIAGNOSIS — R0789 Other chest pain: Secondary | ICD-10-CM | POA: Diagnosis not present

## 2012-08-03 DIAGNOSIS — J159 Unspecified bacterial pneumonia: Secondary | ICD-10-CM | POA: Diagnosis not present

## 2012-08-04 DIAGNOSIS — J159 Unspecified bacterial pneumonia: Secondary | ICD-10-CM | POA: Diagnosis not present

## 2012-08-04 DIAGNOSIS — R509 Fever, unspecified: Secondary | ICD-10-CM | POA: Diagnosis not present

## 2012-08-04 DIAGNOSIS — R0789 Other chest pain: Secondary | ICD-10-CM | POA: Diagnosis not present

## 2012-08-04 DIAGNOSIS — N183 Chronic kidney disease, stage 3 unspecified: Secondary | ICD-10-CM | POA: Diagnosis not present

## 2012-08-09 DIAGNOSIS — J189 Pneumonia, unspecified organism: Secondary | ICD-10-CM | POA: Diagnosis not present

## 2012-08-09 DIAGNOSIS — I998 Other disorder of circulatory system: Secondary | ICD-10-CM | POA: Diagnosis not present

## 2012-08-09 DIAGNOSIS — R071 Chest pain on breathing: Secondary | ICD-10-CM | POA: Diagnosis not present

## 2012-08-09 DIAGNOSIS — N183 Chronic kidney disease, stage 3 unspecified: Secondary | ICD-10-CM | POA: Diagnosis not present

## 2012-09-10 DIAGNOSIS — J189 Pneumonia, unspecified organism: Secondary | ICD-10-CM | POA: Diagnosis not present

## 2012-09-10 DIAGNOSIS — L989 Disorder of the skin and subcutaneous tissue, unspecified: Secondary | ICD-10-CM | POA: Diagnosis not present

## 2012-10-30 DIAGNOSIS — L570 Actinic keratosis: Secondary | ICD-10-CM | POA: Diagnosis not present

## 2012-10-30 DIAGNOSIS — Z85828 Personal history of other malignant neoplasm of skin: Secondary | ICD-10-CM | POA: Diagnosis not present

## 2012-10-30 DIAGNOSIS — D485 Neoplasm of uncertain behavior of skin: Secondary | ICD-10-CM | POA: Diagnosis not present

## 2012-11-08 DIAGNOSIS — I714 Abdominal aortic aneurysm, without rupture: Secondary | ICD-10-CM | POA: Diagnosis not present

## 2012-11-08 DIAGNOSIS — G4733 Obstructive sleep apnea (adult) (pediatric): Secondary | ICD-10-CM | POA: Diagnosis not present

## 2012-11-08 DIAGNOSIS — C61 Malignant neoplasm of prostate: Secondary | ICD-10-CM | POA: Diagnosis not present

## 2012-11-08 DIAGNOSIS — I10 Essential (primary) hypertension: Secondary | ICD-10-CM | POA: Diagnosis not present

## 2012-11-08 DIAGNOSIS — N183 Chronic kidney disease, stage 3 unspecified: Secondary | ICD-10-CM | POA: Diagnosis not present

## 2012-11-08 DIAGNOSIS — Z0000 Encounter for general adult medical examination without abnormal findings: Secondary | ICD-10-CM | POA: Diagnosis not present

## 2012-11-08 DIAGNOSIS — E782 Mixed hyperlipidemia: Secondary | ICD-10-CM | POA: Diagnosis not present

## 2012-12-09 DIAGNOSIS — R946 Abnormal results of thyroid function studies: Secondary | ICD-10-CM | POA: Diagnosis not present

## 2012-12-18 DIAGNOSIS — R972 Elevated prostate specific antigen [PSA]: Secondary | ICD-10-CM | POA: Diagnosis not present

## 2012-12-18 DIAGNOSIS — C61 Malignant neoplasm of prostate: Secondary | ICD-10-CM | POA: Diagnosis not present

## 2013-02-25 DIAGNOSIS — R42 Dizziness and giddiness: Secondary | ICD-10-CM | POA: Diagnosis not present

## 2013-02-25 DIAGNOSIS — I498 Other specified cardiac arrhythmias: Secondary | ICD-10-CM | POA: Diagnosis not present

## 2013-02-28 DIAGNOSIS — R42 Dizziness and giddiness: Secondary | ICD-10-CM | POA: Diagnosis not present

## 2013-03-04 DIAGNOSIS — R42 Dizziness and giddiness: Secondary | ICD-10-CM | POA: Diagnosis not present

## 2013-03-11 DIAGNOSIS — I10 Essential (primary) hypertension: Secondary | ICD-10-CM | POA: Diagnosis not present

## 2013-03-11 DIAGNOSIS — I951 Orthostatic hypotension: Secondary | ICD-10-CM | POA: Diagnosis not present

## 2013-03-11 DIAGNOSIS — I498 Other specified cardiac arrhythmias: Secondary | ICD-10-CM | POA: Diagnosis not present

## 2013-04-15 DIAGNOSIS — M545 Low back pain, unspecified: Secondary | ICD-10-CM | POA: Diagnosis not present

## 2013-04-15 DIAGNOSIS — I10 Essential (primary) hypertension: Secondary | ICD-10-CM | POA: Diagnosis not present

## 2013-04-15 DIAGNOSIS — I951 Orthostatic hypotension: Secondary | ICD-10-CM | POA: Diagnosis not present

## 2013-04-30 DIAGNOSIS — L570 Actinic keratosis: Secondary | ICD-10-CM | POA: Diagnosis not present

## 2013-04-30 DIAGNOSIS — Z85828 Personal history of other malignant neoplasm of skin: Secondary | ICD-10-CM | POA: Diagnosis not present

## 2013-05-01 IMAGING — CT CT PELVIS W/O CM
2 of 3 series · 11 of 36 positions shown, 18 images · non-contrast
Comparison: MRI sacrum/coccyx 06/27/2011 [REDACTED].

CLINICAL DATA: History of prostate cancer for which the patient
underwent prior ablation in 9191, with recent MRI demonstrating
abnormalities in the region of the left iliac bone/SI joint.  Pain
when going up steps.

CT PELVIS WITHOUT CONTRAST 07/31/2011:
TECHNIQUE: Multidetector CT imaging of the pelvis was performed
following the standard protocol without intravenous contrast.

[Series 4: pelvis soft · axial · 0.70mm/px · z∈[-205,-5]mm · 10 of 98 slices shown, 16 images]
[im 9/98  soft-tissue]
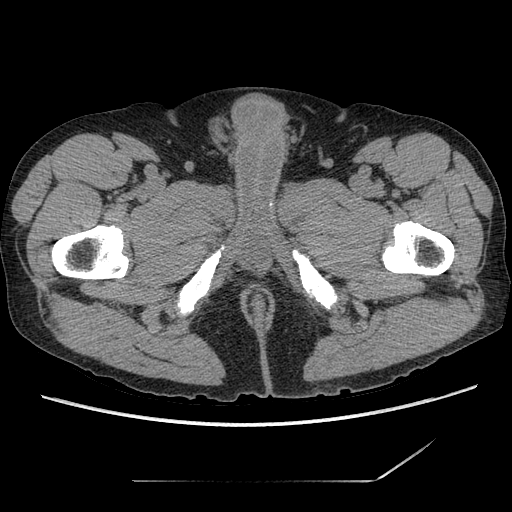
[im 9/98  bone]
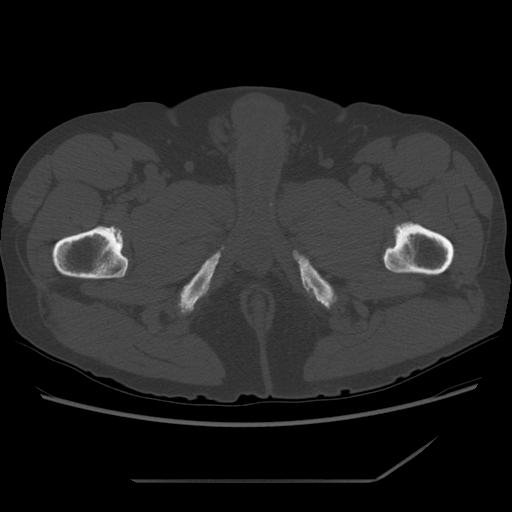
[im 17/98  soft-tissue]
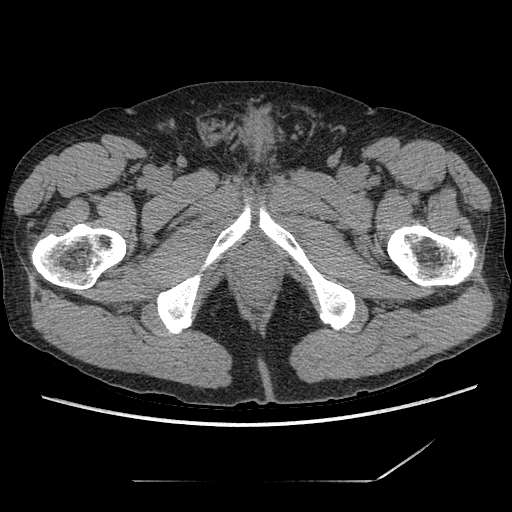
[im 25/98  soft-tissue]
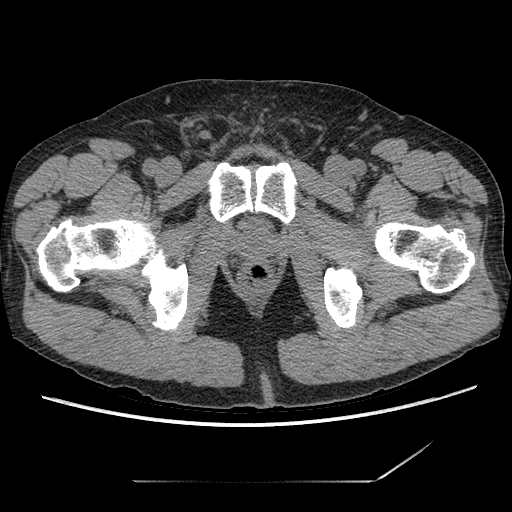
[im 33/98  soft-tissue]
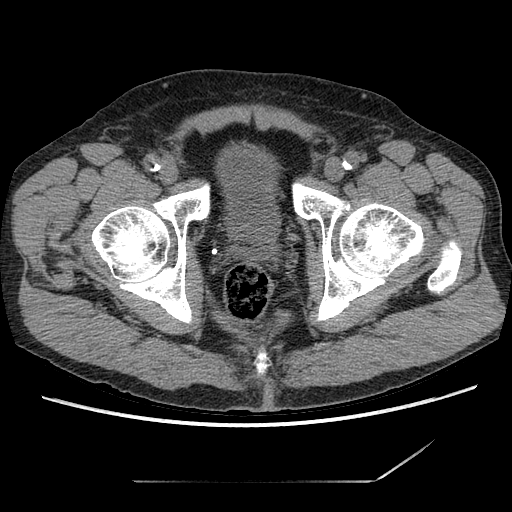
[im 41/98  soft-tissue]
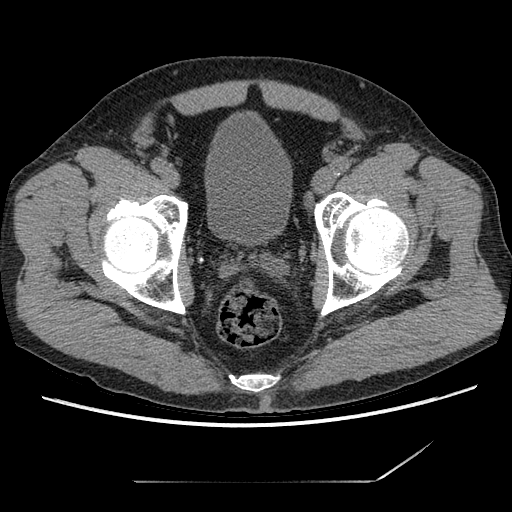
[im 57/98  soft-tissue]
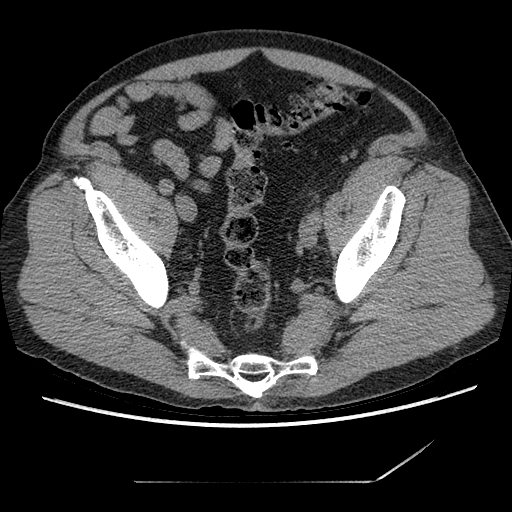
[im 65/98  soft-tissue]
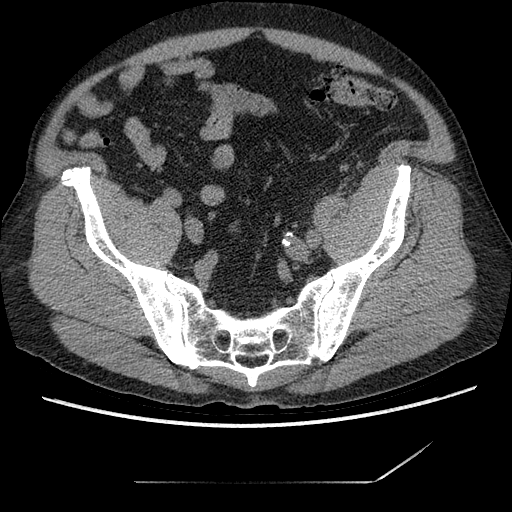
[im 65/98  lung]
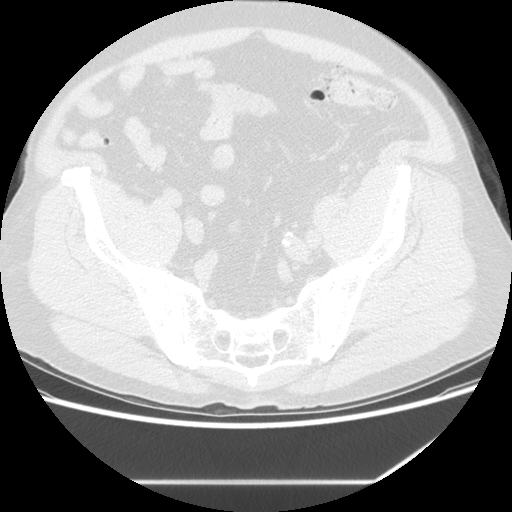
[im 73/98  soft-tissue]
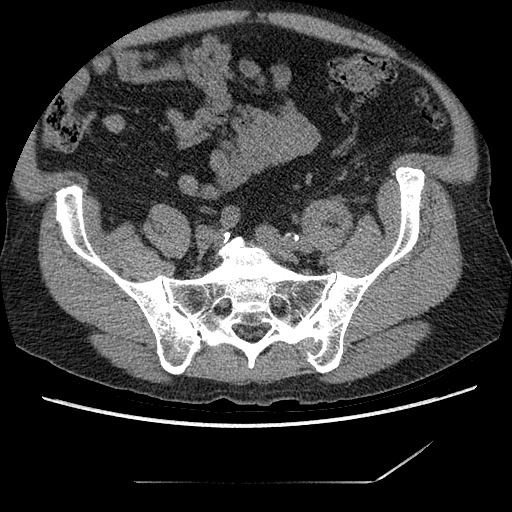
[im 73/98  lung]
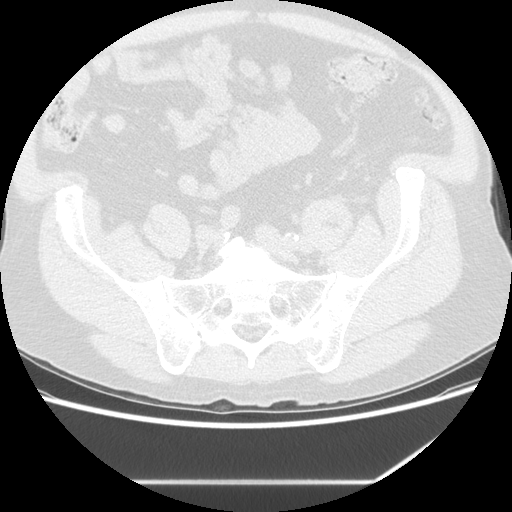
[im 81/98  soft-tissue]
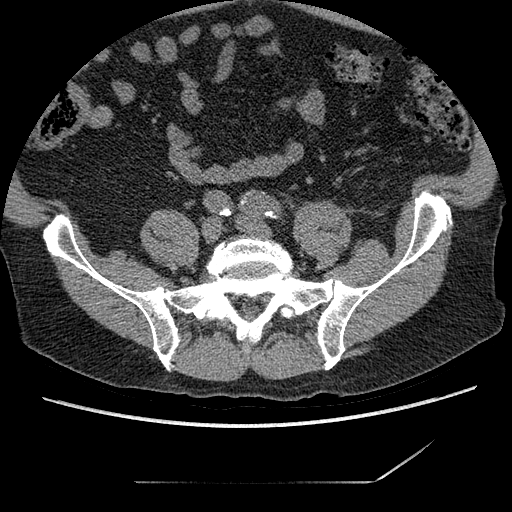
[im 81/98  lung]
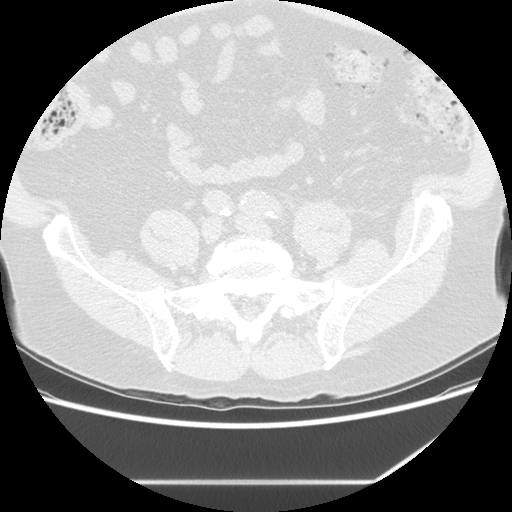
[im 81/98  bone]
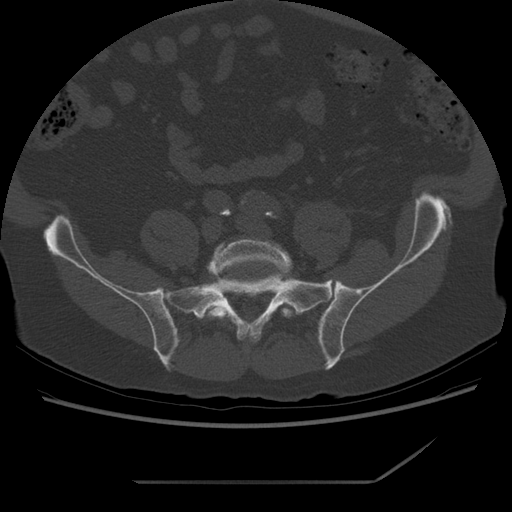
[im 89/98  soft-tissue]
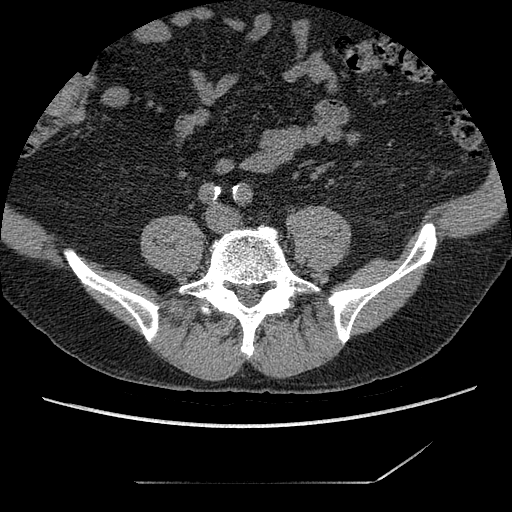
[im 89/98  lung]
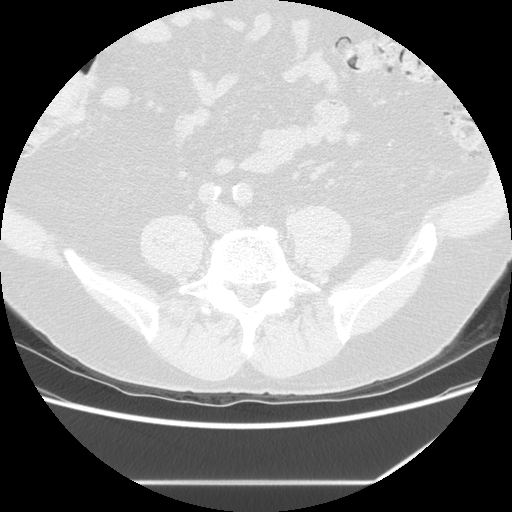

[Series 601: coronal body · coronal · 0.70mm/px · 1 of 119 slices shown, 2 images]
[im 40/119  soft-tissue]
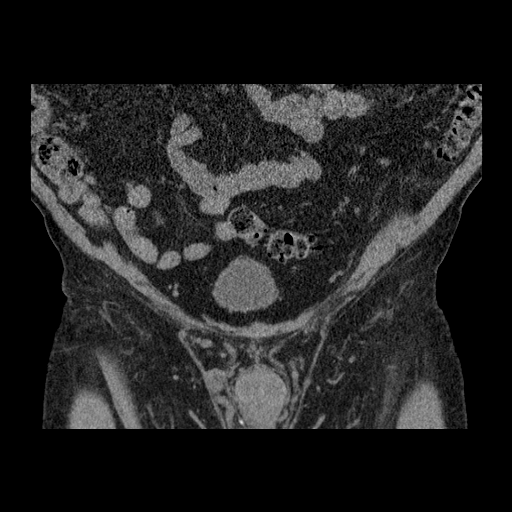
[im 40/119  bone]
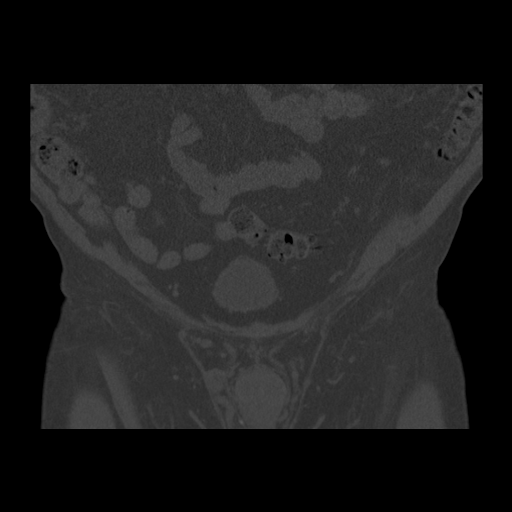

[11 of 36 positions shown; findings below may reference images not displayed]

Pelvis x-rays 06/19/2011 [REDACTED].  I
do not have the report of the MRI for comparison.
FINDINGS: Well-circumscribed lucent lesion in the left posterior
iliac bone at the sacroiliac joint, with a sclerotic margin, though
there is loss of the cortex of the iliac bone posteromedially.
There is communication with the sacroiliac joint along its
posterior margin.  No evidence of soft tissue mass outside the
bone, as the fat plane between the iliac bone and the left
paraspinous muscles is preserved.  Symmetric moderate degenerative
changes are present in both sacroiliac joints.  No other focal
lesion involving the bony pelvis.

Enthesopathy involving the iliac spines and the ischia bilaterally.
Mild degenerative changes involving the hips.  No acute, subacute,
or healed fractures.  Degenerative disc disease at L5-S1 with broad-
based central disc protrusion.  Moderate degenerative changes in
the facet joints at this level.

Soft tissue window images demonstrate sigmoid colon diverticulosis
without evidence of acute diverticulitis.  Mild median lobe
prostate gland enlargement consistent with age.  Aorto-iliofemoral
atherosclerosis without aneurysm.
IMPRESSION: 1. Findings consistent with a large geode involving the posterior
left sacroiliac joints, extending into the left iliac bone.
2.  Degenerative changes in both sacroiliac joints.
3.  Degenerative disc disease and broad-based central disc
protrusion at L5-S1, with moderate bilateral facet degenerative
changes at this level.

Above findings were discussed with Dr. Eni Inoa who agrees
with this interpretation.

## 2013-05-09 DIAGNOSIS — M545 Low back pain, unspecified: Secondary | ICD-10-CM | POA: Diagnosis not present

## 2013-05-09 DIAGNOSIS — E782 Mixed hyperlipidemia: Secondary | ICD-10-CM | POA: Diagnosis not present

## 2013-05-09 DIAGNOSIS — J449 Chronic obstructive pulmonary disease, unspecified: Secondary | ICD-10-CM | POA: Diagnosis not present

## 2013-05-09 DIAGNOSIS — I10 Essential (primary) hypertension: Secondary | ICD-10-CM | POA: Diagnosis not present

## 2013-05-09 DIAGNOSIS — N183 Chronic kidney disease, stage 3 unspecified: Secondary | ICD-10-CM | POA: Diagnosis not present

## 2013-06-05 ENCOUNTER — Other Ambulatory Visit: Payer: Self-pay

## 2013-06-10 ENCOUNTER — Ambulatory Visit: Payer: Medicare Other | Admitting: Family

## 2013-06-10 ENCOUNTER — Other Ambulatory Visit (HOSPITAL_COMMUNITY): Payer: Medicare Other

## 2013-06-16 ENCOUNTER — Encounter: Payer: Self-pay | Admitting: *Deleted

## 2013-06-18 ENCOUNTER — Ambulatory Visit (INDEPENDENT_AMBULATORY_CARE_PROVIDER_SITE_OTHER): Payer: Medicare Other | Admitting: Pulmonary Disease

## 2013-06-18 ENCOUNTER — Encounter: Payer: Self-pay | Admitting: Pulmonary Disease

## 2013-06-18 VITALS — BP 108/80 | HR 69 | Temp 98.0°F | Ht 70.0 in | Wt 211.2 lb

## 2013-06-18 DIAGNOSIS — Z23 Encounter for immunization: Secondary | ICD-10-CM

## 2013-06-18 DIAGNOSIS — G4733 Obstructive sleep apnea (adult) (pediatric): Secondary | ICD-10-CM | POA: Diagnosis not present

## 2013-06-18 DIAGNOSIS — J449 Chronic obstructive pulmonary disease, unspecified: Secondary | ICD-10-CM

## 2013-06-18 NOTE — Assessment & Plan Note (Signed)
The patient has mild to moderate obstructive lung disease, but appears to be doing well on dulera.  I have asked him to continue on this medication, and also to work on weight loss and a conditioning program.

## 2013-06-18 NOTE — Assessment & Plan Note (Signed)
The patient is doing well on CPAP, and I have asked him to keep up with his mask changes and supplies.

## 2013-06-18 NOTE — Patient Instructions (Signed)
Continue on your breathing medications. Stay on cpap, and keep up with mask changes and supplies Work on weight loss and conditioning.  Will give you the flu shot today followup with me again in one year, but call if having issues.

## 2013-06-18 NOTE — Progress Notes (Signed)
  Subjective:    Patient ID: Christopher Wilkerson, male    DOB: 05/29/1936, 77 y.o.   MRN: [id number]  HPI Patient comes in today for followup of his known COPD and obstructive sleep apnea.  He is wearing CPAP compliantly, and is having no issues with his mask fit or pressure.  He is sleeping well with the device, and satisfied with his daytime alertness.  He states that his breathing has been stable since his last visit, and he has remained on his bronchodilator regimen.  He denies any significant chest congestion or cough.  He is due for his flu shot this visit.   Review of Systems  Constitutional: Negative for fever and unexpected weight change.  HENT: Negative for congestion, dental problem, ear pain, nosebleeds, postnasal drip, rhinorrhea, sinus pressure, sneezing, sore throat and trouble swallowing.   Eyes: Negative for redness and itching.  Respiratory: Negative for cough, chest tightness, shortness of breath and wheezing.   Cardiovascular: Negative for palpitations and leg swelling.  Gastrointestinal: Negative for nausea and vomiting.  Genitourinary: Negative for dysuria.  Musculoskeletal: Negative for joint swelling.  Skin: Negative for rash.  Neurological: Negative for headaches.  Hematological: Does not bruise/bleed easily.  Psychiatric/Behavioral: Negative for dysphoric mood. The patient is not nervous/anxious.        Objective:   Physical Exam Overweight male in no acute distress Nose without purulence or discharge noted No skin breakdown or pressure necrosis from the CPAP mask Neck without lymphadenopathy or thyromegaly Chest with mildly decreased breath sounds, no wheezes or rhonchi Cardiac exam with regular rate and rhythm Lower extremities with minimal right ankle edema, no cyanosis Alert and oriented, moves all 4 extremities.       Assessment & Plan:

## 2013-06-20 DIAGNOSIS — M76899 Other specified enthesopathies of unspecified lower limb, excluding foot: Secondary | ICD-10-CM | POA: Diagnosis not present

## 2013-06-23 ENCOUNTER — Encounter: Payer: Self-pay | Admitting: Family

## 2013-06-24 ENCOUNTER — Ambulatory Visit: Payer: Medicare Other | Admitting: Family

## 2013-06-24 ENCOUNTER — Inpatient Hospital Stay (HOSPITAL_COMMUNITY): Admission: RE | Admit: 2013-06-24 | Payer: Medicare Other | Source: Ambulatory Visit

## 2013-10-14 DIAGNOSIS — S61209A Unspecified open wound of unspecified finger without damage to nail, initial encounter: Secondary | ICD-10-CM | POA: Diagnosis not present

## 2013-11-05 ENCOUNTER — Other Ambulatory Visit: Payer: Self-pay | Admitting: Dermatology

## 2013-11-05 DIAGNOSIS — L570 Actinic keratosis: Secondary | ICD-10-CM | POA: Diagnosis not present

## 2013-11-05 DIAGNOSIS — C44211 Basal cell carcinoma of skin of unspecified ear and external auricular canal: Secondary | ICD-10-CM | POA: Diagnosis not present

## 2013-11-05 DIAGNOSIS — Z85828 Personal history of other malignant neoplasm of skin: Secondary | ICD-10-CM | POA: Diagnosis not present

## 2013-11-17 DIAGNOSIS — I10 Essential (primary) hypertension: Secondary | ICD-10-CM | POA: Diagnosis not present

## 2013-11-17 DIAGNOSIS — J449 Chronic obstructive pulmonary disease, unspecified: Secondary | ICD-10-CM | POA: Diagnosis not present

## 2013-11-17 DIAGNOSIS — E782 Mixed hyperlipidemia: Secondary | ICD-10-CM | POA: Diagnosis not present

## 2013-11-17 DIAGNOSIS — N183 Chronic kidney disease, stage 3 unspecified: Secondary | ICD-10-CM | POA: Diagnosis not present

## 2013-11-17 DIAGNOSIS — C61 Malignant neoplasm of prostate: Secondary | ICD-10-CM | POA: Diagnosis not present

## 2013-11-17 DIAGNOSIS — Z0000 Encounter for general adult medical examination without abnormal findings: Secondary | ICD-10-CM | POA: Diagnosis not present

## 2013-11-17 DIAGNOSIS — M545 Low back pain, unspecified: Secondary | ICD-10-CM | POA: Diagnosis not present

## 2013-12-12 DIAGNOSIS — C61 Malignant neoplasm of prostate: Secondary | ICD-10-CM | POA: Diagnosis not present

## 2013-12-17 DIAGNOSIS — L708 Other acne: Secondary | ICD-10-CM | POA: Diagnosis not present

## 2013-12-17 DIAGNOSIS — Z85828 Personal history of other malignant neoplasm of skin: Secondary | ICD-10-CM | POA: Diagnosis not present

## 2013-12-19 DIAGNOSIS — N3941 Urge incontinence: Secondary | ICD-10-CM | POA: Diagnosis not present

## 2013-12-19 DIAGNOSIS — C61 Malignant neoplasm of prostate: Secondary | ICD-10-CM | POA: Diagnosis not present

## 2013-12-23 ENCOUNTER — Other Ambulatory Visit (HOSPITAL_COMMUNITY): Payer: Self-pay | Admitting: Urology

## 2013-12-23 DIAGNOSIS — C61 Malignant neoplasm of prostate: Secondary | ICD-10-CM

## 2014-01-05 ENCOUNTER — Encounter (HOSPITAL_COMMUNITY): Payer: Self-pay

## 2014-01-05 ENCOUNTER — Encounter (HOSPITAL_COMMUNITY)
Admission: RE | Admit: 2014-01-05 | Discharge: 2014-01-05 | Disposition: A | Discharge status: Home / Self Care | Payer: Medicare Other | Source: Ambulatory Visit | Attending: Urology | Admitting: Urology

## 2014-01-05 DIAGNOSIS — C61 Malignant neoplasm of prostate: Secondary | ICD-10-CM

## 2014-01-05 MED ORDER — FLUDEOXYGLUCOSE F - 18 (FDG) INJECTION
10.7000 | Freq: Once | INTRAVENOUS | Status: AC | PRN
  Administered 2014-01-05: 10.7 via INTRAVENOUS

## 2014-01-07 DIAGNOSIS — N269 Renal sclerosis, unspecified: Secondary | ICD-10-CM | POA: Diagnosis not present

## 2014-01-07 DIAGNOSIS — C61 Malignant neoplasm of prostate: Secondary | ICD-10-CM | POA: Diagnosis not present

## 2014-01-16 ENCOUNTER — Other Ambulatory Visit: Payer: Self-pay | Admitting: Internal Medicine

## 2014-01-16 DIAGNOSIS — R911 Solitary pulmonary nodule: Secondary | ICD-10-CM | POA: Diagnosis not present

## 2014-01-16 DIAGNOSIS — I6529 Occlusion and stenosis of unspecified carotid artery: Secondary | ICD-10-CM | POA: Diagnosis not present

## 2014-01-22 ENCOUNTER — Ambulatory Visit
Admission: RE | Admit: 2014-01-22 | Discharge: 2014-01-22 | Disposition: A | Discharge status: Home / Self Care | Payer: Medicare Other | Source: Ambulatory Visit | Attending: Internal Medicine | Admitting: Internal Medicine

## 2014-01-22 DIAGNOSIS — I6529 Occlusion and stenosis of unspecified carotid artery: Secondary | ICD-10-CM

## 2014-01-22 DIAGNOSIS — R0989 Other specified symptoms and signs involving the circulatory and respiratory systems: Secondary | ICD-10-CM | POA: Diagnosis not present

## 2014-03-18 DIAGNOSIS — Z85828 Personal history of other malignant neoplasm of skin: Secondary | ICD-10-CM | POA: Diagnosis not present

## 2014-03-18 DIAGNOSIS — L820 Inflamed seborrheic keratosis: Secondary | ICD-10-CM | POA: Diagnosis not present

## 2014-03-18 DIAGNOSIS — L259 Unspecified contact dermatitis, unspecified cause: Secondary | ICD-10-CM | POA: Diagnosis not present

## 2014-04-20 ENCOUNTER — Ambulatory Visit
Admission: RE | Admit: 2014-04-20 | Discharge: 2014-04-20 | Disposition: A | Discharge status: Home / Self Care | Payer: Medicare Other | Source: Ambulatory Visit | Attending: Internal Medicine | Admitting: Internal Medicine

## 2014-04-20 DIAGNOSIS — R911 Solitary pulmonary nodule: Secondary | ICD-10-CM

## 2014-04-20 DIAGNOSIS — J438 Other emphysema: Secondary | ICD-10-CM | POA: Diagnosis not present

## 2014-04-20 DIAGNOSIS — I2510 Atherosclerotic heart disease of native coronary artery without angina pectoris: Secondary | ICD-10-CM | POA: Diagnosis not present

## 2014-04-20 DIAGNOSIS — Z8546 Personal history of malignant neoplasm of prostate: Secondary | ICD-10-CM | POA: Diagnosis not present

## 2014-04-20 DIAGNOSIS — I6529 Occlusion and stenosis of unspecified carotid artery: Secondary | ICD-10-CM

## 2014-05-13 ENCOUNTER — Encounter: Payer: Self-pay | Admitting: *Deleted

## 2014-05-18 ENCOUNTER — Other Ambulatory Visit: Payer: Self-pay | Admitting: Internal Medicine

## 2014-05-18 DIAGNOSIS — Z23 Encounter for immunization: Secondary | ICD-10-CM | POA: Diagnosis not present

## 2014-05-18 DIAGNOSIS — R911 Solitary pulmonary nodule: Secondary | ICD-10-CM

## 2014-05-18 DIAGNOSIS — I10 Essential (primary) hypertension: Secondary | ICD-10-CM | POA: Diagnosis not present

## 2014-05-18 DIAGNOSIS — E782 Mixed hyperlipidemia: Secondary | ICD-10-CM | POA: Diagnosis not present

## 2014-05-18 DIAGNOSIS — N183 Chronic kidney disease, stage 3 (moderate): Secondary | ICD-10-CM | POA: Diagnosis not present

## 2014-05-27 ENCOUNTER — Encounter: Payer: Self-pay | Admitting: Pulmonary Disease

## 2014-05-27 ENCOUNTER — Ambulatory Visit (INDEPENDENT_AMBULATORY_CARE_PROVIDER_SITE_OTHER): Payer: Medicare Other | Admitting: Pulmonary Disease

## 2014-05-27 VITALS — BP 124/66 | HR 56 | Temp 98.8°F | Ht 70.0 in | Wt 210.0 lb

## 2014-05-27 DIAGNOSIS — J438 Other emphysema: Secondary | ICD-10-CM

## 2014-05-27 DIAGNOSIS — I6529 Occlusion and stenosis of unspecified carotid artery: Secondary | ICD-10-CM

## 2014-05-27 DIAGNOSIS — G4733 Obstructive sleep apnea (adult) (pediatric): Secondary | ICD-10-CM

## 2014-05-27 NOTE — Assessment & Plan Note (Signed)
The patient is stable from a COPD standpoint on his current regimen. I have asked him to stay as active as possible, and work on conditioning and weight.

## 2014-05-27 NOTE — Progress Notes (Signed)
   Subjective:    Patient ID: Christopher Wilkerson, male    DOB: 1936-05-02, 78 y.o.   MRN: [id number]  HPI Patient comes in today for follow-up of his COPD as well as obstructive sleep apnea.  He is wearing CPAP compliantly, and is having no issues with his mask fit or pressure. His only complaint is that of early morning awakenings, and from his description it sounds most consistent with an advanced sleep phase syndrome. He is also staying on his bronchodilator regimen, and feels that his exertional tolerance is at baseline.   Review of Systems  Constitutional: Negative for fever and unexpected weight change.  HENT: Positive for postnasal drip, rhinorrhea and sinus pressure. Negative for congestion, dental problem, ear pain, nosebleeds, sneezing, sore throat and trouble swallowing.   Eyes: Negative for redness and itching.  Respiratory: Positive for cough. Negative for chest tightness, shortness of breath and wheezing.   Cardiovascular: Negative for palpitations and leg swelling.  Gastrointestinal: Negative for nausea and vomiting.  Genitourinary: Negative for dysuria.  Musculoskeletal: Negative for joint swelling.  Skin: Negative for rash.  Neurological: Negative for headaches.  Hematological: Does not bruise/bleed easily.  Psychiatric/Behavioral: Negative for dysphoric mood. The patient is not nervous/anxious.        Objective:   Physical Exam Overweight male in no acute distress Nose without purulence or discharge noted Neck without lymphadenopathy or thyromegaly No skin breakdown or pressure necrosis from the sleep apnea mask Chest with mildly decreased breath sounds, no wheezing Exam with regular rate and rhythm Lower extremities without significant edema, no cyanosis Alert and oriented, moves all 4 extremities.       Assessment & Plan:

## 2014-05-27 NOTE — Patient Instructions (Signed)
No change in medications for your breathing. Stay on cpap, and keep up with mask changes and supplies. Work on weight loss Try melatonin 3mg  about 3 hrs before bedtime to see if helps with early morning awakenings. followup with me again in one year.

## 2014-05-27 NOTE — Assessment & Plan Note (Signed)
The patient is having no issues with his sleep apnea device, and feels that his apnea is well controlled. He is describing an advanced sleep phase syndrome, and I have made some suggestions to see if this will help.

## 2014-06-02 DIAGNOSIS — M25551 Pain in right hip: Secondary | ICD-10-CM | POA: Diagnosis not present

## 2014-06-02 DIAGNOSIS — M25552 Pain in left hip: Secondary | ICD-10-CM | POA: Diagnosis not present

## 2014-06-08 DIAGNOSIS — M7071 Other bursitis of hip, right hip: Secondary | ICD-10-CM | POA: Diagnosis not present

## 2014-06-08 DIAGNOSIS — M7072 Other bursitis of hip, left hip: Secondary | ICD-10-CM | POA: Diagnosis not present

## 2014-06-10 DIAGNOSIS — R972 Elevated prostate specific antigen [PSA]: Secondary | ICD-10-CM | POA: Diagnosis not present

## 2014-06-15 DIAGNOSIS — M7072 Other bursitis of hip, left hip: Secondary | ICD-10-CM | POA: Diagnosis not present

## 2014-06-15 DIAGNOSIS — M7071 Other bursitis of hip, right hip: Secondary | ICD-10-CM | POA: Diagnosis not present

## 2014-06-17 DIAGNOSIS — N261 Atrophy of kidney (terminal): Secondary | ICD-10-CM | POA: Diagnosis not present

## 2014-06-17 DIAGNOSIS — C61 Malignant neoplasm of prostate: Secondary | ICD-10-CM | POA: Diagnosis not present

## 2014-06-17 DIAGNOSIS — R972 Elevated prostate specific antigen [PSA]: Secondary | ICD-10-CM | POA: Diagnosis not present

## 2014-06-22 DIAGNOSIS — M7071 Other bursitis of hip, right hip: Secondary | ICD-10-CM | POA: Diagnosis not present

## 2014-06-22 DIAGNOSIS — M7072 Other bursitis of hip, left hip: Secondary | ICD-10-CM | POA: Diagnosis not present

## 2014-06-29 DIAGNOSIS — M7071 Other bursitis of hip, right hip: Secondary | ICD-10-CM | POA: Diagnosis not present

## 2014-06-29 DIAGNOSIS — M7072 Other bursitis of hip, left hip: Secondary | ICD-10-CM | POA: Diagnosis not present

## 2014-07-17 ENCOUNTER — Other Ambulatory Visit: Payer: Medicare Other

## 2014-08-03 ENCOUNTER — Other Ambulatory Visit: Payer: Medicare Other

## 2014-08-03 ENCOUNTER — Ambulatory Visit
Admission: RE | Admit: 2014-08-03 | Discharge: 2014-08-03 | Disposition: A | Discharge status: Home / Self Care | Payer: Medicare Other | Source: Ambulatory Visit | Attending: Internal Medicine | Admitting: Internal Medicine

## 2014-08-03 DIAGNOSIS — R911 Solitary pulmonary nodule: Secondary | ICD-10-CM | POA: Diagnosis not present

## 2014-08-03 DIAGNOSIS — Z8546 Personal history of malignant neoplasm of prostate: Secondary | ICD-10-CM | POA: Diagnosis not present

## 2014-08-03 DIAGNOSIS — I712 Thoracic aortic aneurysm, without rupture: Secondary | ICD-10-CM | POA: Diagnosis not present

## 2014-08-03 DIAGNOSIS — J8410 Pulmonary fibrosis, unspecified: Secondary | ICD-10-CM | POA: Diagnosis not present

## 2014-10-15 DIAGNOSIS — M7072 Other bursitis of hip, left hip: Secondary | ICD-10-CM | POA: Diagnosis not present

## 2014-10-15 DIAGNOSIS — M7071 Other bursitis of hip, right hip: Secondary | ICD-10-CM | POA: Diagnosis not present

## 2014-11-05 DIAGNOSIS — L820 Inflamed seborrheic keratosis: Secondary | ICD-10-CM | POA: Diagnosis not present

## 2014-11-05 DIAGNOSIS — C4401 Basal cell carcinoma of skin of lip: Secondary | ICD-10-CM | POA: Diagnosis not present

## 2014-11-05 DIAGNOSIS — C44319 Basal cell carcinoma of skin of other parts of face: Secondary | ICD-10-CM | POA: Diagnosis not present

## 2014-11-16 ENCOUNTER — Telehealth: Payer: Self-pay | Admitting: Pulmonary Disease

## 2014-11-16 DIAGNOSIS — S93421A Sprain of deltoid ligament of right ankle, initial encounter: Secondary | ICD-10-CM | POA: Diagnosis not present

## 2014-11-16 NOTE — Telephone Encounter (Signed)
Spoke with Tammy at [facility]. She gave the number to pt's insurance company >> [phone]  Called [employer] and spoke with [place]. Was told that [facility] did not have a cover alternative. PA was initiated.  It has been put through to clinical review. Reference #: [id number]  Will route to Mindy to follow up in.

## 2014-11-17 MED ORDER — FLUTICASONE FUROATE-VILANTEROL 100-25 MCG/INH IN AEPB: 1.0000 | INHALATION_SPRAY | Freq: Every day | RESPIRATORY_TRACT | Status: DC

## 2014-11-17 NOTE — Telephone Encounter (Signed)
breo 100, one inhalation each am everyday

## 2014-11-17 NOTE — Telephone Encounter (Signed)
Received denial for pt dulera stating it is not covered d/t pt DX. The alternatives are advair diskus, breo ellipta. Please advise thanks

## 2014-11-17 NOTE — Telephone Encounter (Signed)
Spoke with the pt and notified of recs per [facility]  Rx for Breo was sent to pharm

## 2014-11-24 DIAGNOSIS — C61 Malignant neoplasm of prostate: Secondary | ICD-10-CM | POA: Diagnosis not present

## 2014-11-24 DIAGNOSIS — Z1389 Encounter for screening for other disorder: Secondary | ICD-10-CM | POA: Diagnosis not present

## 2014-11-24 DIAGNOSIS — N183 Chronic kidney disease, stage 3 (moderate): Secondary | ICD-10-CM | POA: Diagnosis not present

## 2014-11-24 DIAGNOSIS — Z0001 Encounter for general adult medical examination with abnormal findings: Secondary | ICD-10-CM | POA: Diagnosis not present

## 2014-11-24 DIAGNOSIS — I10 Essential (primary) hypertension: Secondary | ICD-10-CM | POA: Diagnosis not present

## 2014-11-24 DIAGNOSIS — J449 Chronic obstructive pulmonary disease, unspecified: Secondary | ICD-10-CM | POA: Diagnosis not present

## 2014-11-24 DIAGNOSIS — E782 Mixed hyperlipidemia: Secondary | ICD-10-CM | POA: Diagnosis not present

## 2014-11-24 DIAGNOSIS — R911 Solitary pulmonary nodule: Secondary | ICD-10-CM | POA: Diagnosis not present

## 2014-12-16 DIAGNOSIS — R3915 Urgency of urination: Secondary | ICD-10-CM | POA: Diagnosis not present

## 2014-12-16 DIAGNOSIS — N3941 Urge incontinence: Secondary | ICD-10-CM | POA: Diagnosis not present

## 2014-12-22 DIAGNOSIS — R21 Rash and other nonspecific skin eruption: Secondary | ICD-10-CM | POA: Diagnosis not present

## 2014-12-22 DIAGNOSIS — W57XXXA Bitten or stung by nonvenomous insect and other nonvenomous arthropods, initial encounter: Secondary | ICD-10-CM | POA: Diagnosis not present

## 2014-12-23 DIAGNOSIS — C61 Malignant neoplasm of prostate: Secondary | ICD-10-CM | POA: Diagnosis not present

## 2014-12-30 DIAGNOSIS — R972 Elevated prostate specific antigen [PSA]: Secondary | ICD-10-CM | POA: Diagnosis not present

## 2014-12-30 DIAGNOSIS — N3941 Urge incontinence: Secondary | ICD-10-CM | POA: Diagnosis not present

## 2014-12-30 DIAGNOSIS — C61 Malignant neoplasm of prostate: Secondary | ICD-10-CM | POA: Diagnosis not present

## 2014-12-30 DIAGNOSIS — N261 Atrophy of kidney (terminal): Secondary | ICD-10-CM | POA: Diagnosis not present

## 2014-12-30 DIAGNOSIS — R3915 Urgency of urination: Secondary | ICD-10-CM | POA: Diagnosis not present

## 2015-01-04 DIAGNOSIS — C61 Malignant neoplasm of prostate: Secondary | ICD-10-CM | POA: Diagnosis not present

## 2015-01-04 DIAGNOSIS — G4730 Sleep apnea, unspecified: Secondary | ICD-10-CM | POA: Diagnosis not present

## 2015-01-07 DIAGNOSIS — R351 Nocturia: Secondary | ICD-10-CM | POA: Diagnosis not present

## 2015-01-07 DIAGNOSIS — N3941 Urge incontinence: Secondary | ICD-10-CM | POA: Diagnosis not present

## 2015-01-07 DIAGNOSIS — R3915 Urgency of urination: Secondary | ICD-10-CM | POA: Diagnosis not present

## 2015-01-14 DIAGNOSIS — N3941 Urge incontinence: Secondary | ICD-10-CM | POA: Diagnosis not present

## 2015-01-21 DIAGNOSIS — N3941 Urge incontinence: Secondary | ICD-10-CM | POA: Diagnosis not present

## 2015-01-22 DIAGNOSIS — G4737 Central sleep apnea in conditions classified elsewhere: Secondary | ICD-10-CM | POA: Diagnosis not present

## 2015-01-22 DIAGNOSIS — G4733 Obstructive sleep apnea (adult) (pediatric): Secondary | ICD-10-CM | POA: Diagnosis not present

## 2015-01-22 DIAGNOSIS — R063 Periodic breathing: Secondary | ICD-10-CM | POA: Diagnosis not present

## 2015-01-22 DIAGNOSIS — G4761 Periodic limb movement disorder: Secondary | ICD-10-CM | POA: Diagnosis not present

## 2015-01-25 ENCOUNTER — Other Ambulatory Visit: Payer: Self-pay | Admitting: Internal Medicine

## 2015-01-25 DIAGNOSIS — R2230 Localized swelling, mass and lump, unspecified upper limb: Secondary | ICD-10-CM

## 2015-01-25 DIAGNOSIS — R911 Solitary pulmonary nodule: Secondary | ICD-10-CM

## 2015-01-26 ENCOUNTER — Telehealth: Payer: Self-pay | Admitting: Pulmonary Disease

## 2015-01-26 ENCOUNTER — Ambulatory Visit (INDEPENDENT_AMBULATORY_CARE_PROVIDER_SITE_OTHER)
Admission: RE | Admit: 2015-01-26 | Discharge: 2015-01-26 | Disposition: A | Discharge status: Home / Self Care | Payer: Medicare Other | Source: Ambulatory Visit | Attending: Internal Medicine | Admitting: Internal Medicine

## 2015-01-26 ENCOUNTER — Encounter: Payer: Self-pay | Admitting: Internal Medicine

## 2015-01-26 ENCOUNTER — Other Ambulatory Visit (INDEPENDENT_AMBULATORY_CARE_PROVIDER_SITE_OTHER): Payer: Medicare Other

## 2015-01-26 ENCOUNTER — Ambulatory Visit (INDEPENDENT_AMBULATORY_CARE_PROVIDER_SITE_OTHER): Payer: Medicare Other | Admitting: Internal Medicine

## 2015-01-26 VITALS — BP 124/70 | HR 63 | Ht 70.5 in | Wt 207.6 lb

## 2015-01-26 DIAGNOSIS — I10 Essential (primary) hypertension: Secondary | ICD-10-CM

## 2015-01-26 DIAGNOSIS — R0600 Dyspnea, unspecified: Secondary | ICD-10-CM | POA: Diagnosis not present

## 2015-01-26 DIAGNOSIS — N189 Chronic kidney disease, unspecified: Secondary | ICD-10-CM | POA: Diagnosis not present

## 2015-01-26 DIAGNOSIS — R0602 Shortness of breath: Secondary | ICD-10-CM | POA: Diagnosis not present

## 2015-01-26 DIAGNOSIS — N183 Chronic kidney disease, stage 3 unspecified: Secondary | ICD-10-CM | POA: Insufficient documentation

## 2015-01-26 DIAGNOSIS — J449 Chronic obstructive pulmonary disease, unspecified: Secondary | ICD-10-CM | POA: Diagnosis not present

## 2015-01-26 LAB — BASIC METABOLIC PANEL
BUN: 23 mg/dL (ref 6–23)
CALCIUM: 9.4 mg/dL (ref 8.4–10.5)
CO2: 26 meq/L (ref 19–32)
CREATININE: 1.8 mg/dL — AB (ref 0.40–1.50)
Chloride: 106 mEq/L (ref 96–112)
GFR: 39.07 mL/min — AB (ref >60.00)
GLUCOSE: 89 mg/dL (ref 70–99)
Potassium: 4.1 mEq/L (ref 3.5–5.1)
Sodium: 139 mEq/L (ref 135–145)

## 2015-01-26 LAB — CBC WITH DIFFERENTIAL/PLATELET
Basophils Absolute: 0 10*3/uL (ref 0.0–0.1)
Basophils Relative: 0.3 % (ref 0.0–3.0)
EOS ABS: 0.4 10*3/uL (ref 0.0–0.7)
Eosinophils Relative: 4 % (ref 0.0–5.0)
HEMATOCRIT: 43.8 % (ref 39.0–52.0)
Hemoglobin: 15 g/dL (ref 13.0–17.0)
LYMPHS ABS: 1.6 10*3/uL (ref 0.7–4.0)
LYMPHS PCT: 15.3 % (ref 12.0–46.0)
MCHC: 34.3 g/dL (ref 30.0–36.0)
MCV: 96.5 fl (ref 78.0–100.0)
Monocytes Absolute: 1.1 10*3/uL — ABNORMAL HIGH (ref 0.1–1.0)
Monocytes Relative: 10.8 % (ref 3.0–12.0)
NEUTROS ABS: 7.3 10*3/uL (ref 1.4–7.7)
Neutrophils Relative %: 69.6 % (ref 43.0–77.0)
Platelets: 204 10*3/uL (ref 150.0–400.0)
RBC: 4.54 Mil/uL (ref 4.22–5.81)
RDW: 12.6 % (ref 11.5–15.5)
WBC: 10.5 10*3/uL (ref 4.0–10.5)

## 2015-01-26 LAB — TSH: TSH: 4.41 u[IU]/mL (ref 0.35–4.50)

## 2015-01-26 LAB — BRAIN NATRIURETIC PEPTIDE: PRO B NATRI PEPTIDE: 33 pg/mL (ref 0.0–100.0)

## 2015-01-26 LAB — TROPONIN I: TNIDX: 0 ug/l (ref 0.00–0.06)

## 2015-01-26 MED ORDER — PANTOPRAZOLE SODIUM 40 MG PO TBEC: 40.0000 mg | DELAYED_RELEASE_TABLET | Freq: Every day | ORAL | Status: DC

## 2015-01-26 MED ORDER — FAMOTIDINE 20 MG PO TABS: ORAL_TABLET | ORAL | Status: DC

## 2015-01-26 MED ORDER — VALSARTAN 160 MG PO TABS: 160.0000 mg | ORAL_TABLET | Freq: Every day | ORAL | Status: DC

## 2015-01-26 NOTE — Patient Instructions (Addendum)
Stop lotensin and start diovan 160 mg daily   Stop Breo and just use albuterol as needed   Pantoprazole (protonix) 40 mg   Take  30-60 min before first meal of the day and Pepcid (famotidine)  20 mg one @  bedtime until return to office - this is the best way to tell whether stomach acid is contributing to your problem.    GERD (REFLUX)  is an extremely common cause of respiratory symptoms just like yours , many times with no obvious heartburn at all.    It can be treated with medication, but also with lifestyle changes including elevation of the head of your bed (ideally with 6 inch  bed blocks),  Smoking cessation, avoidance of late meals, excessive alcohol, and avoid fatty foods, chocolate, peppermint, colas, red wine, and acidic juices such as orange juice.  NO MINT OR MENTHOL PRODUCTS SO NO COUGH DROPS  USE SUGARLESS CANDY INSTEAD (Jolley ranchers or Stover's or Life Savers) or even ice chips will also do - the key is to swallow to prevent all throat clearing. NO OIL BASED VITAMINS - use powdered substitutes.   Please schedule a follow up office visit in 4 weeks, sooner if needed to see Tammy NP  Late add needs bmet on f/u

## 2015-01-26 NOTE — Assessment & Plan Note (Addendum)
ACE inhibitors are problematic in  pts with airway complaints because  even experienced pulmonologists can't always distinguish ace effects from copd/asthma/pnds/ allergies etc.  By themselves they don't actually cause a problem, much like oxygen can't by itself start a fire, but they certainly serve as a powerful catalyst or enhancer for any "fire"  or inflammatory process in the upper airway, be it caused by an ET  tube or more commonly reflux (especially in the obese or pts with known GERD or who are on biphoshonates) or URI's, due to interference with bradykinin clearance.  The effects of acei on bradykinin levels occurs in 100% of pt's on acei (unless they surreptitiously stop the med!) but the classic cough is only reported in 5%.  This leaves 95% of pts on acei's  with a variety of syndromes including no identifiable symptom in most  vs non-specific symptoms that wax and wane depending on what other insult is occuring at the level of the upper airway such as GERD or the effects of dpi on the upper airway, both of which need to be eliminated until sort out this problem  Try diovan 160 mg daily x 4 weeks then recheck bmet as also has cri (see sep a/p) Might consider consolidate rx and just use minoxidil for the vasodilator and low dose BB if creat or bp not adequate on f/u

## 2015-01-26 NOTE — Telephone Encounter (Signed)
Spoke with pt and he states he is having trouble breathing. Feels he can't fill his lungs up with air. Pt scheduled for appt today at 1:30 with MW. Nothing further needed.

## 2015-01-26 NOTE — Progress Notes (Signed)
Subjective:     Patient ID: Christopher Wilkerson, male   DOB: 08/17/1935    MRN: [id number]  HPI  10 yowm quit 1976 with unexplained sob with minimally abn  pfts and nl  cpst in 2011     01/26/2015 1st [facility] office visit/ Paola Flynt   Chief Complaint  Patient presents with  . Acute Visit    Pt c/o increased SOB for the past 3 wks. He states he gets out of breath walking to his mailbox. He used rescue inhaler a few days ago which is rare for him.   failed to improve on spiriva rx so maintained Breo / feels tight when take a deep breath center of chest but not consistently related to ex (uphill to house back from [place])  Variable sob x years attributed to ? Copd vs emphysema vs asthma per pt.  Not sure saba helping. More day than noct symptoms    No obvious day to day or daytime variabilty or assoc excess or purulent sputum or cp or chest tightness, subjective wheeze overt sinus or hb symptoms. No unusual exp hx or h/o childhood pna/ asthma or knowledge of premature birth.  Sleeping ok without nocturnal  or early am exacerbation  of respiratory  c/o's or need for noct saba. Also denies any obvious fluctuation of symptoms with weather or environmental changes or other aggravating or alleviating factors except as outlined above   Current Medications, Allergies, Complete Past Medical History, Past Surgical History, Family History, and Social History were reviewed in [employer] record.  ROS  The following are not active complaints unless bolded sore throat, dysphagia, dental problems, itching, sneezing,  nasal congestion or excess/ purulent secretions, ear ache,   fever, chills, sweats, unintended wt loss, pleuritic or exertional cp, hemoptysis,  orthopnea pnd or leg swelling, presyncope, palpitations, abdominal pain, anorexia, nausea, vomiting, diarrhea  or change in bowel or urinary habits, change in stools or urine, dysuria,hematuria,  rash, arthralgias, visual complaints,  headache, numbness weakness or ataxia or problems with walking or coordination,  change in mood/affect or memory.         Review of Systems     Objective:   Physical Exam    amb wm nad / classic voice fatigue/ dry sounding throat clearing  Wt Readings from Last 3 Encounters:  01/26/15 207 lb 9.6 oz (94.167 kg)  05/27/14 210 lb (95.255 kg)  06/18/13 211 lb 3.2 oz (95.8 kg)    Vital signs reviewed   HEENT: nl dentition, turbinates, and orophanx. Nl external ear canals without cough reflex   NECK :  without JVD/Nodes/TM/ nl carotid upstrokes bilaterally   LUNGS: no acc muscle use, clear to A and P bilaterally without cough on insp or exp maneuvers   CV:  RRR  no s3 or murmur or increase in P2, no edema   ABD:  soft and nontender with nl excursion in the supine position. No bruits or organomegaly, bowel sounds nl  MS:  warm without deformities, calf tenderness, cyanosis or clubbing  SKIN: warm and dry without lesions    NEURO:  alert, approp, no deficits     CXR PA and Lateral:   01/26/2015 :     I personally reviewed images and agree with radiology impression as follows:    Increasing airspace opacity in the right upper lobe in an area previously seen scar. Cannot exclude infiltrate/pneumonia.   Labs ordered/ reviewed:   Lab 01/26/15 1408  NA 139  K 4.1  CL 106  CO2 26  BUN 23  CREATININE 1.80*  GLUCOSE 89      Lab 01/26/15 1408  HGB 15.0  HCT 43.8  WBC 10.5  PLT 204.0     Lab Results  Component Value Date   TSH 4.41 01/26/2015     Lab Results  Component Value Date   PROBNP 33.0 01/26/2015    Ordered D dimer     Assessment:

## 2015-01-26 NOTE — Assessment & Plan Note (Signed)
Lab Results  Component Value Date   CREATININE 1.80* 01/26/2015    Estimated Creatinine Clearance: 40 mL/min (by C-G formula based on Cr of 1.8).   Recheck creat in 4 weeks on diovan instead of ACEi.

## 2015-01-26 NOTE — Assessment & Plan Note (Signed)
01/26/2015  Walked RA x 3 laps @ 185 ft each stopped due to  End of study min sob/ no desat/ no cp   Symptoms are markedly disproportionate to objective findings and not clear this is a lung problem but pt does appear to have difficult airway management issues. DDX of  difficult airways management all start with A and  include Adherence, Ace Inhibitors, Acid Reflux, Active Sinus Disease, Alpha 1 Antitripsin deficiency, Anxiety masquerading as Airways dz,  ABPA,  allergy(esp in young), Aspiration (esp in elderly), Adverse effects of meds,  Active smokers, A bunch of PE's (a small clot burden can't cause this syndrome unless there is already severe underlying pulm or vascular dz with poor reserve) plus two Bs  = Bronchiectasis and Beta blocker use..and one C= CHF   ACEi effects  need to be at the top of this list given the chronic poorly controlled airway symptoms in a pt with unexplained variably severe sob here  ? adverse effect of dpi > first spiriva failed, now BREO has failed to controlled his symptoms > try off and just use prn saba fo rnow since could  still have asthma   ? A bunch of PEs unlikely > check d dimer   ?  chf >  BNP so low rules this out    I had an extended discussion with the patient reviewing all relevant studies completed to date and  lasting 25 minutes of a 40  minute visit    Each maintenance medication was reviewed in detail including most importantly the difference between maintenance and prns and under what circumstances the prns are to be triggered using an action plan format that is not reflected in the computer generated alphabetically organized AVS.    Please see instructions for details which were reviewed in writing and the patient given a copy highlighting the part that I personally wrote and discussed at today's ov.

## 2015-01-27 ENCOUNTER — Ambulatory Visit
Admission: RE | Admit: 2015-01-27 | Discharge: 2015-01-27 | Disposition: A | Discharge status: Home / Self Care | Payer: Medicare Other | Source: Ambulatory Visit | Attending: Internal Medicine | Admitting: Internal Medicine

## 2015-01-27 ENCOUNTER — Telehealth: Payer: Self-pay | Admitting: *Deleted

## 2015-01-27 DIAGNOSIS — R0989 Other specified symptoms and signs involving the circulatory and respiratory systems: Secondary | ICD-10-CM | POA: Diagnosis not present

## 2015-01-27 DIAGNOSIS — R972 Elevated prostate specific antigen [PSA]: Secondary | ICD-10-CM | POA: Diagnosis not present

## 2015-01-27 DIAGNOSIS — R911 Solitary pulmonary nodule: Secondary | ICD-10-CM | POA: Diagnosis not present

## 2015-01-27 DIAGNOSIS — R918 Other nonspecific abnormal finding of lung field: Secondary | ICD-10-CM

## 2015-01-27 DIAGNOSIS — R399 Unspecified symptoms and signs involving the genitourinary system: Secondary | ICD-10-CM | POA: Diagnosis not present

## 2015-01-27 DIAGNOSIS — Z8546 Personal history of malignant neoplasm of prostate: Secondary | ICD-10-CM | POA: Diagnosis not present

## 2015-01-27 LAB — D-DIMER, QUANTITATIVE (NOT AT ARMC): D DIMER QUANT: 0.7 ug{FEU}/mL — AB (ref 0.00–0.48)

## 2015-01-27 NOTE — Telephone Encounter (Signed)
-----   Message from Tanda Rockers, MD sent at 01/26/2015  8:58 PM EDT ----- Make sure he understands  1) stop breo an just use saba prn for now 2) keep appt for chest

## 2015-01-27 NOTE — Progress Notes (Signed)
Quick Note:  Spoke with pt and notified of results per Dr. Wert. Pt verbalized understanding and denied any questions.  ______ 

## 2015-01-27 NOTE — Telephone Encounter (Signed)
Spoke with the pt and went over the below recs  He verbalized understanding  Will keep CT Chest appt for this pm

## 2015-01-28 ENCOUNTER — Other Ambulatory Visit: Payer: PRIVATE HEALTH INSURANCE

## 2015-01-28 DIAGNOSIS — R918 Other nonspecific abnormal finding of lung field: Secondary | ICD-10-CM | POA: Insufficient documentation

## 2015-01-28 DIAGNOSIS — N3941 Urge incontinence: Secondary | ICD-10-CM | POA: Diagnosis not present

## 2015-01-29 ENCOUNTER — Telehealth: Payer: Self-pay | Admitting: Internal Medicine

## 2015-01-29 NOTE — Telephone Encounter (Signed)
Patient Instructions     Stop lotensin and start diovan 160 mg daily   Stop Breo and just use albuterol as needed   Pantoprazole (protonix) 40 mg Take 30-60 min before first meal of the day and Pepcid (famotidine) 20 mg one @ bedtime until return to office - this is the best way to tell whether stomach acid is contributing to your problem.   GERD (REFLUX) is an extremely common cause of respiratory symptoms just like yours , many times with no obvious heartburn at all.   It can be treated with medication, but also with lifestyle changes including elevation of the head of your bed (ideally with 6 inch bed blocks), Smoking cessation, avoidance of late meals, excessive alcohol, and avoid fatty foods, chocolate, peppermint, colas, red wine, and acidic juices such as orange juice.  NO MINT OR MENTHOL PRODUCTS SO NO COUGH DROPS  USE SUGARLESS CANDY INSTEAD (Jolley ranchers or Stover's or Life Savers) or even ice chips will also do - the key is to swallow to prevent all throat clearing. NO OIL BASED VITAMINS - use powdered substitutes.  Please schedule a follow up office visit in 4 weeks, sooner if needed to see Tammy NP  Late add needs bmet on f/u    Spoke with pt. Advised him that MW wanted him to continue the reflux meds until he came back to see him on 02/23/15. Nothing further was needed at this time.

## 2015-02-02 DIAGNOSIS — M25551 Pain in right hip: Secondary | ICD-10-CM | POA: Diagnosis not present

## 2015-02-03 NOTE — Progress Notes (Signed)
Quick Note:  Spoke with pt and notified of results per Dr. Wert. Pt verbalized understanding and denied any questions.  ______ 

## 2015-02-04 DIAGNOSIS — N3941 Urge incontinence: Secondary | ICD-10-CM | POA: Diagnosis not present

## 2015-02-04 DIAGNOSIS — Z8546 Personal history of malignant neoplasm of prostate: Secondary | ICD-10-CM | POA: Diagnosis not present

## 2015-02-08 ENCOUNTER — Other Ambulatory Visit: Payer: Self-pay | Admitting: Internal Medicine

## 2015-02-08 DIAGNOSIS — Z8546 Personal history of malignant neoplasm of prostate: Secondary | ICD-10-CM | POA: Diagnosis not present

## 2015-02-08 DIAGNOSIS — R911 Solitary pulmonary nodule: Secondary | ICD-10-CM

## 2015-02-08 DIAGNOSIS — G4730 Sleep apnea, unspecified: Secondary | ICD-10-CM | POA: Diagnosis not present

## 2015-02-08 DIAGNOSIS — J189 Pneumonia, unspecified organism: Secondary | ICD-10-CM | POA: Diagnosis not present

## 2015-02-08 DIAGNOSIS — C61 Malignant neoplasm of prostate: Secondary | ICD-10-CM | POA: Diagnosis not present

## 2015-02-10 DIAGNOSIS — Z8546 Personal history of malignant neoplasm of prostate: Secondary | ICD-10-CM | POA: Diagnosis not present

## 2015-02-10 DIAGNOSIS — R399 Unspecified symptoms and signs involving the genitourinary system: Secondary | ICD-10-CM | POA: Diagnosis not present

## 2015-02-11 DIAGNOSIS — N3941 Urge incontinence: Secondary | ICD-10-CM | POA: Diagnosis not present

## 2015-02-23 ENCOUNTER — Ambulatory Visit: Payer: Medicare Other | Admitting: Adult Health

## 2015-02-25 DIAGNOSIS — N3941 Urge incontinence: Secondary | ICD-10-CM | POA: Diagnosis not present

## 2015-03-02 DIAGNOSIS — M1611 Unilateral primary osteoarthritis, right hip: Secondary | ICD-10-CM | POA: Diagnosis not present

## 2015-03-04 DIAGNOSIS — N3941 Urge incontinence: Secondary | ICD-10-CM | POA: Diagnosis not present

## 2015-03-11 DIAGNOSIS — N3941 Urge incontinence: Secondary | ICD-10-CM | POA: Diagnosis not present

## 2015-03-17 DIAGNOSIS — M25551 Pain in right hip: Secondary | ICD-10-CM | POA: Diagnosis not present

## 2015-03-17 DIAGNOSIS — M7071 Other bursitis of hip, right hip: Secondary | ICD-10-CM | POA: Diagnosis not present

## 2015-03-18 DIAGNOSIS — N3941 Urge incontinence: Secondary | ICD-10-CM | POA: Diagnosis not present

## 2015-03-24 DIAGNOSIS — L821 Other seborrheic keratosis: Secondary | ICD-10-CM | POA: Diagnosis not present

## 2015-03-24 DIAGNOSIS — L570 Actinic keratosis: Secondary | ICD-10-CM | POA: Diagnosis not present

## 2015-03-25 DIAGNOSIS — N3941 Urge incontinence: Secondary | ICD-10-CM | POA: Diagnosis not present

## 2015-04-01 DIAGNOSIS — C61 Malignant neoplasm of prostate: Secondary | ICD-10-CM | POA: Diagnosis not present

## 2015-04-01 DIAGNOSIS — N3941 Urge incontinence: Secondary | ICD-10-CM | POA: Diagnosis not present

## 2015-04-28 DIAGNOSIS — M7072 Other bursitis of hip, left hip: Secondary | ICD-10-CM | POA: Diagnosis not present

## 2015-04-28 DIAGNOSIS — M7071 Other bursitis of hip, right hip: Secondary | ICD-10-CM | POA: Diagnosis not present

## 2015-05-04 DIAGNOSIS — C61 Malignant neoplasm of prostate: Secondary | ICD-10-CM | POA: Diagnosis not present

## 2015-05-05 ENCOUNTER — Telehealth: Payer: Self-pay | Admitting: Pulmonary Disease

## 2015-05-05 NOTE — Telephone Encounter (Signed)
Spoke with pt and scheduled f/u with TP for 05/14/15  He could not wait until Jan b/c he will be out of town from Jan until April 2017

## 2015-05-11 ENCOUNTER — Ambulatory Visit
Admission: RE | Admit: 2015-05-11 | Discharge: 2015-05-11 | Disposition: A | Discharge status: Home / Self Care | Payer: Medicare Other | Source: Ambulatory Visit | Attending: Internal Medicine | Admitting: Internal Medicine

## 2015-05-11 DIAGNOSIS — R911 Solitary pulmonary nodule: Secondary | ICD-10-CM

## 2015-05-11 DIAGNOSIS — J439 Emphysema, unspecified: Secondary | ICD-10-CM | POA: Diagnosis not present

## 2015-05-12 DIAGNOSIS — R9721 Rising PSA following treatment for malignant neoplasm of prostate: Secondary | ICD-10-CM | POA: Diagnosis not present

## 2015-05-12 DIAGNOSIS — R399 Unspecified symptoms and signs involving the genitourinary system: Secondary | ICD-10-CM | POA: Diagnosis not present

## 2015-05-14 ENCOUNTER — Ambulatory Visit: Payer: Medicare Other | Admitting: Adult Health

## 2015-05-21 DIAGNOSIS — H2513 Age-related nuclear cataract, bilateral: Secondary | ICD-10-CM | POA: Diagnosis not present

## 2015-05-26 ENCOUNTER — Ambulatory Visit: Payer: Medicare Other | Admitting: Pulmonary Disease

## 2015-05-31 DIAGNOSIS — C61 Malignant neoplasm of prostate: Secondary | ICD-10-CM | POA: Diagnosis not present

## 2015-05-31 DIAGNOSIS — N183 Chronic kidney disease, stage 3 (moderate): Secondary | ICD-10-CM | POA: Diagnosis not present

## 2015-05-31 DIAGNOSIS — J449 Chronic obstructive pulmonary disease, unspecified: Secondary | ICD-10-CM | POA: Diagnosis not present

## 2015-05-31 DIAGNOSIS — R252 Cramp and spasm: Secondary | ICD-10-CM | POA: Diagnosis not present

## 2015-05-31 DIAGNOSIS — Z23 Encounter for immunization: Secondary | ICD-10-CM | POA: Diagnosis not present

## 2015-05-31 DIAGNOSIS — R911 Solitary pulmonary nodule: Secondary | ICD-10-CM | POA: Diagnosis not present

## 2015-05-31 DIAGNOSIS — I10 Essential (primary) hypertension: Secondary | ICD-10-CM | POA: Diagnosis not present

## 2015-06-16 DIAGNOSIS — M25552 Pain in left hip: Secondary | ICD-10-CM | POA: Diagnosis not present

## 2015-06-16 DIAGNOSIS — M7072 Other bursitis of hip, left hip: Secondary | ICD-10-CM | POA: Diagnosis not present

## 2015-06-16 DIAGNOSIS — M7071 Other bursitis of hip, right hip: Secondary | ICD-10-CM | POA: Diagnosis not present

## 2015-06-16 DIAGNOSIS — M25551 Pain in right hip: Secondary | ICD-10-CM | POA: Diagnosis not present

## 2015-06-17 DIAGNOSIS — J449 Chronic obstructive pulmonary disease, unspecified: Secondary | ICD-10-CM | POA: Diagnosis not present

## 2015-06-17 DIAGNOSIS — Z79899 Other long term (current) drug therapy: Secondary | ICD-10-CM | POA: Diagnosis not present

## 2015-06-17 DIAGNOSIS — I10 Essential (primary) hypertension: Secondary | ICD-10-CM | POA: Diagnosis not present

## 2015-06-17 DIAGNOSIS — Z87891 Personal history of nicotine dependence: Secondary | ICD-10-CM | POA: Diagnosis not present

## 2015-06-17 DIAGNOSIS — Z7982 Long term (current) use of aspirin: Secondary | ICD-10-CM | POA: Diagnosis not present

## 2015-07-05 DIAGNOSIS — M7072 Other bursitis of hip, left hip: Secondary | ICD-10-CM | POA: Diagnosis not present

## 2015-07-05 DIAGNOSIS — M7071 Other bursitis of hip, right hip: Secondary | ICD-10-CM | POA: Diagnosis not present

## 2015-07-07 DIAGNOSIS — M7072 Other bursitis of hip, left hip: Secondary | ICD-10-CM | POA: Diagnosis not present

## 2015-07-07 DIAGNOSIS — M7071 Other bursitis of hip, right hip: Secondary | ICD-10-CM | POA: Diagnosis not present

## 2015-07-08 DIAGNOSIS — Z8546 Personal history of malignant neoplasm of prostate: Secondary | ICD-10-CM | POA: Diagnosis not present

## 2015-07-08 DIAGNOSIS — Z09 Encounter for follow-up examination after completed treatment for conditions other than malignant neoplasm: Secondary | ICD-10-CM | POA: Diagnosis not present

## 2015-07-12 DIAGNOSIS — M7071 Other bursitis of hip, right hip: Secondary | ICD-10-CM | POA: Diagnosis not present

## 2015-07-12 DIAGNOSIS — M7072 Other bursitis of hip, left hip: Secondary | ICD-10-CM | POA: Diagnosis not present

## 2015-07-13 DIAGNOSIS — M7072 Other bursitis of hip, left hip: Secondary | ICD-10-CM | POA: Diagnosis not present

## 2015-07-13 DIAGNOSIS — M7071 Other bursitis of hip, right hip: Secondary | ICD-10-CM | POA: Diagnosis not present

## 2015-07-14 DIAGNOSIS — Z8546 Personal history of malignant neoplasm of prostate: Secondary | ICD-10-CM | POA: Diagnosis not present

## 2015-07-14 DIAGNOSIS — R399 Unspecified symptoms and signs involving the genitourinary system: Secondary | ICD-10-CM | POA: Diagnosis not present

## 2015-07-15 DIAGNOSIS — M7071 Other bursitis of hip, right hip: Secondary | ICD-10-CM | POA: Diagnosis not present

## 2015-07-15 DIAGNOSIS — M7072 Other bursitis of hip, left hip: Secondary | ICD-10-CM | POA: Diagnosis not present

## 2015-07-20 DIAGNOSIS — M7072 Other bursitis of hip, left hip: Secondary | ICD-10-CM | POA: Diagnosis not present

## 2015-07-20 DIAGNOSIS — M7071 Other bursitis of hip, right hip: Secondary | ICD-10-CM | POA: Diagnosis not present

## 2015-07-22 DIAGNOSIS — M7071 Other bursitis of hip, right hip: Secondary | ICD-10-CM | POA: Diagnosis not present

## 2015-07-22 DIAGNOSIS — M7072 Other bursitis of hip, left hip: Secondary | ICD-10-CM | POA: Diagnosis not present

## 2015-07-27 DIAGNOSIS — M7072 Other bursitis of hip, left hip: Secondary | ICD-10-CM | POA: Diagnosis not present

## 2015-07-27 DIAGNOSIS — M7071 Other bursitis of hip, right hip: Secondary | ICD-10-CM | POA: Diagnosis not present

## 2015-08-03 DIAGNOSIS — H25812 Combined forms of age-related cataract, left eye: Secondary | ICD-10-CM | POA: Diagnosis not present

## 2015-08-03 DIAGNOSIS — H2512 Age-related nuclear cataract, left eye: Secondary | ICD-10-CM | POA: Diagnosis not present

## 2015-08-24 DIAGNOSIS — H25811 Combined forms of age-related cataract, right eye: Secondary | ICD-10-CM | POA: Diagnosis not present

## 2015-08-24 DIAGNOSIS — H2511 Age-related nuclear cataract, right eye: Secondary | ICD-10-CM | POA: Diagnosis not present

## 2015-09-23 DIAGNOSIS — Z961 Presence of intraocular lens: Secondary | ICD-10-CM | POA: Diagnosis not present

## 2015-10-25 DIAGNOSIS — Z08 Encounter for follow-up examination after completed treatment for malignant neoplasm: Secondary | ICD-10-CM | POA: Diagnosis not present

## 2015-10-25 DIAGNOSIS — Z8546 Personal history of malignant neoplasm of prostate: Secondary | ICD-10-CM | POA: Diagnosis not present

## 2015-10-27 DIAGNOSIS — Z8546 Personal history of malignant neoplasm of prostate: Secondary | ICD-10-CM | POA: Diagnosis not present

## 2015-10-27 DIAGNOSIS — R399 Unspecified symptoms and signs involving the genitourinary system: Secondary | ICD-10-CM | POA: Diagnosis not present

## 2015-10-28 DIAGNOSIS — J209 Acute bronchitis, unspecified: Secondary | ICD-10-CM | POA: Diagnosis not present

## 2015-11-10 DIAGNOSIS — R300 Dysuria: Secondary | ICD-10-CM | POA: Diagnosis not present

## 2015-11-11 DIAGNOSIS — J449 Chronic obstructive pulmonary disease, unspecified: Secondary | ICD-10-CM | POA: Diagnosis not present

## 2015-11-11 DIAGNOSIS — Z7982 Long term (current) use of aspirin: Secondary | ICD-10-CM | POA: Diagnosis not present

## 2015-11-11 DIAGNOSIS — I10 Essential (primary) hypertension: Secondary | ICD-10-CM | POA: Diagnosis not present

## 2015-11-11 DIAGNOSIS — Z79899 Other long term (current) drug therapy: Secondary | ICD-10-CM | POA: Diagnosis not present

## 2015-11-11 DIAGNOSIS — Z7951 Long term (current) use of inhaled steroids: Secondary | ICD-10-CM | POA: Diagnosis not present

## 2015-11-11 DIAGNOSIS — Z87891 Personal history of nicotine dependence: Secondary | ICD-10-CM | POA: Diagnosis not present

## 2015-11-11 DIAGNOSIS — R911 Solitary pulmonary nodule: Secondary | ICD-10-CM | POA: Diagnosis not present

## 2015-11-29 DIAGNOSIS — Z1389 Encounter for screening for other disorder: Secondary | ICD-10-CM | POA: Diagnosis not present

## 2015-11-29 DIAGNOSIS — C61 Malignant neoplasm of prostate: Secondary | ICD-10-CM | POA: Diagnosis not present

## 2015-11-29 DIAGNOSIS — J449 Chronic obstructive pulmonary disease, unspecified: Secondary | ICD-10-CM | POA: Diagnosis not present

## 2015-11-29 DIAGNOSIS — E7800 Pure hypercholesterolemia, unspecified: Secondary | ICD-10-CM | POA: Diagnosis not present

## 2015-11-29 DIAGNOSIS — R911 Solitary pulmonary nodule: Secondary | ICD-10-CM | POA: Diagnosis not present

## 2015-11-29 DIAGNOSIS — R946 Abnormal results of thyroid function studies: Secondary | ICD-10-CM | POA: Diagnosis not present

## 2015-11-29 DIAGNOSIS — N183 Chronic kidney disease, stage 3 (moderate): Secondary | ICD-10-CM | POA: Diagnosis not present

## 2015-11-29 DIAGNOSIS — I10 Essential (primary) hypertension: Secondary | ICD-10-CM | POA: Diagnosis not present

## 2015-11-29 DIAGNOSIS — Z0000 Encounter for general adult medical examination without abnormal findings: Secondary | ICD-10-CM | POA: Diagnosis not present

## 2015-12-01 DIAGNOSIS — R399 Unspecified symptoms and signs involving the genitourinary system: Secondary | ICD-10-CM | POA: Diagnosis not present

## 2015-12-01 DIAGNOSIS — Z8546 Personal history of malignant neoplasm of prostate: Secondary | ICD-10-CM | POA: Diagnosis not present

## 2015-12-15 DIAGNOSIS — R946 Abnormal results of thyroid function studies: Secondary | ICD-10-CM | POA: Diagnosis not present

## 2016-01-06 DIAGNOSIS — Z8546 Personal history of malignant neoplasm of prostate: Secondary | ICD-10-CM | POA: Diagnosis not present

## 2016-01-06 DIAGNOSIS — R3915 Urgency of urination: Secondary | ICD-10-CM | POA: Diagnosis not present

## 2016-01-06 DIAGNOSIS — R350 Frequency of micturition: Secondary | ICD-10-CM | POA: Diagnosis not present

## 2016-01-07 DIAGNOSIS — M722 Plantar fascial fibromatosis: Secondary | ICD-10-CM | POA: Diagnosis not present

## 2016-02-09 DIAGNOSIS — R3915 Urgency of urination: Secondary | ICD-10-CM | POA: Diagnosis not present

## 2016-02-16 DIAGNOSIS — R350 Frequency of micturition: Secondary | ICD-10-CM | POA: Diagnosis not present

## 2016-02-16 DIAGNOSIS — R3915 Urgency of urination: Secondary | ICD-10-CM | POA: Diagnosis not present

## 2016-03-22 DIAGNOSIS — D225 Melanocytic nevi of trunk: Secondary | ICD-10-CM | POA: Diagnosis not present

## 2016-03-22 DIAGNOSIS — C44319 Basal cell carcinoma of skin of other parts of face: Secondary | ICD-10-CM | POA: Diagnosis not present

## 2016-03-22 DIAGNOSIS — R3915 Urgency of urination: Secondary | ICD-10-CM | POA: Diagnosis not present

## 2016-03-22 DIAGNOSIS — D1801 Hemangioma of skin and subcutaneous tissue: Secondary | ICD-10-CM | POA: Diagnosis not present

## 2016-03-22 DIAGNOSIS — L570 Actinic keratosis: Secondary | ICD-10-CM | POA: Diagnosis not present

## 2016-05-02 DIAGNOSIS — Z8546 Personal history of malignant neoplasm of prostate: Secondary | ICD-10-CM | POA: Diagnosis not present

## 2016-05-08 DIAGNOSIS — Z8546 Personal history of malignant neoplasm of prostate: Secondary | ICD-10-CM | POA: Diagnosis not present

## 2016-05-08 DIAGNOSIS — Z08 Encounter for follow-up examination after completed treatment for malignant neoplasm: Secondary | ICD-10-CM | POA: Diagnosis not present

## 2016-05-10 DIAGNOSIS — Z8546 Personal history of malignant neoplasm of prostate: Secondary | ICD-10-CM | POA: Diagnosis not present

## 2016-05-10 DIAGNOSIS — R399 Unspecified symptoms and signs involving the genitourinary system: Secondary | ICD-10-CM | POA: Diagnosis not present

## 2016-05-31 DIAGNOSIS — L570 Actinic keratosis: Secondary | ICD-10-CM | POA: Diagnosis not present

## 2016-05-31 DIAGNOSIS — J449 Chronic obstructive pulmonary disease, unspecified: Secondary | ICD-10-CM | POA: Diagnosis not present

## 2016-05-31 DIAGNOSIS — C61 Malignant neoplasm of prostate: Secondary | ICD-10-CM | POA: Diagnosis not present

## 2016-05-31 DIAGNOSIS — I10 Essential (primary) hypertension: Secondary | ICD-10-CM | POA: Diagnosis not present

## 2016-05-31 DIAGNOSIS — Z23 Encounter for immunization: Secondary | ICD-10-CM | POA: Diagnosis not present

## 2016-05-31 DIAGNOSIS — E7800 Pure hypercholesterolemia, unspecified: Secondary | ICD-10-CM | POA: Diagnosis not present

## 2016-05-31 DIAGNOSIS — N183 Chronic kidney disease, stage 3 (moderate): Secondary | ICD-10-CM | POA: Diagnosis not present

## 2016-05-31 DIAGNOSIS — Z85828 Personal history of other malignant neoplasm of skin: Secondary | ICD-10-CM | POA: Diagnosis not present

## 2016-08-10 DIAGNOSIS — J069 Acute upper respiratory infection, unspecified: Secondary | ICD-10-CM | POA: Diagnosis not present

## 2016-08-10 DIAGNOSIS — R42 Dizziness and giddiness: Secondary | ICD-10-CM | POA: Diagnosis not present

## 2016-09-07 DIAGNOSIS — M542 Cervicalgia: Secondary | ICD-10-CM | POA: Diagnosis not present

## 2016-10-16 DIAGNOSIS — L820 Inflamed seborrheic keratosis: Secondary | ICD-10-CM | POA: Diagnosis not present

## 2016-10-16 DIAGNOSIS — L570 Actinic keratosis: Secondary | ICD-10-CM | POA: Diagnosis not present

## 2016-11-02 DIAGNOSIS — Z08 Encounter for follow-up examination after completed treatment for malignant neoplasm: Secondary | ICD-10-CM | POA: Diagnosis not present

## 2016-11-02 DIAGNOSIS — C61 Malignant neoplasm of prostate: Secondary | ICD-10-CM | POA: Diagnosis not present

## 2016-11-02 DIAGNOSIS — Z8546 Personal history of malignant neoplasm of prostate: Secondary | ICD-10-CM | POA: Diagnosis not present

## 2016-11-08 DIAGNOSIS — J449 Chronic obstructive pulmonary disease, unspecified: Secondary | ICD-10-CM | POA: Diagnosis not present

## 2016-11-08 DIAGNOSIS — R399 Unspecified symptoms and signs involving the genitourinary system: Secondary | ICD-10-CM | POA: Diagnosis not present

## 2016-11-08 DIAGNOSIS — Z08 Encounter for follow-up examination after completed treatment for malignant neoplasm: Secondary | ICD-10-CM | POA: Diagnosis not present

## 2016-11-08 DIAGNOSIS — Z8546 Personal history of malignant neoplasm of prostate: Secondary | ICD-10-CM | POA: Diagnosis not present

## 2016-11-15 DIAGNOSIS — T8182XD Emphysema (subcutaneous) resulting from a procedure, subsequent encounter: Secondary | ICD-10-CM | POA: Diagnosis not present

## 2016-11-15 DIAGNOSIS — Z79899 Other long term (current) drug therapy: Secondary | ICD-10-CM | POA: Diagnosis not present

## 2016-11-15 DIAGNOSIS — Z7982 Long term (current) use of aspirin: Secondary | ICD-10-CM | POA: Diagnosis not present

## 2016-11-15 DIAGNOSIS — J439 Emphysema, unspecified: Secondary | ICD-10-CM | POA: Diagnosis not present

## 2016-11-15 DIAGNOSIS — Z87891 Personal history of nicotine dependence: Secondary | ICD-10-CM | POA: Diagnosis not present

## 2016-11-15 DIAGNOSIS — I10 Essential (primary) hypertension: Secondary | ICD-10-CM | POA: Diagnosis not present

## 2016-11-15 DIAGNOSIS — R911 Solitary pulmonary nodule: Secondary | ICD-10-CM | POA: Diagnosis not present

## 2016-12-13 DIAGNOSIS — J449 Chronic obstructive pulmonary disease, unspecified: Secondary | ICD-10-CM | POA: Diagnosis not present

## 2016-12-13 DIAGNOSIS — I714 Abdominal aortic aneurysm, without rupture: Secondary | ICD-10-CM | POA: Diagnosis not present

## 2016-12-13 DIAGNOSIS — C61 Malignant neoplasm of prostate: Secondary | ICD-10-CM | POA: Diagnosis not present

## 2016-12-13 DIAGNOSIS — E7800 Pure hypercholesterolemia, unspecified: Secondary | ICD-10-CM | POA: Diagnosis not present

## 2016-12-13 DIAGNOSIS — I10 Essential (primary) hypertension: Secondary | ICD-10-CM | POA: Diagnosis not present

## 2016-12-13 DIAGNOSIS — Z0000 Encounter for general adult medical examination without abnormal findings: Secondary | ICD-10-CM | POA: Diagnosis not present

## 2016-12-13 DIAGNOSIS — N183 Chronic kidney disease, stage 3 (moderate): Secondary | ICD-10-CM | POA: Diagnosis not present

## 2016-12-13 DIAGNOSIS — Z1389 Encounter for screening for other disorder: Secondary | ICD-10-CM | POA: Diagnosis not present

## 2016-12-13 DIAGNOSIS — I700 Atherosclerosis of aorta: Secondary | ICD-10-CM | POA: Diagnosis not present

## 2017-01-02 DIAGNOSIS — R946 Abnormal results of thyroid function studies: Secondary | ICD-10-CM | POA: Diagnosis not present

## 2017-01-02 DIAGNOSIS — R194 Change in bowel habit: Secondary | ICD-10-CM | POA: Diagnosis not present

## 2017-01-02 DIAGNOSIS — E039 Hypothyroidism, unspecified: Secondary | ICD-10-CM | POA: Diagnosis not present

## 2017-01-15 DIAGNOSIS — R194 Change in bowel habit: Secondary | ICD-10-CM | POA: Diagnosis not present

## 2017-01-15 DIAGNOSIS — K621 Rectal polyp: Secondary | ICD-10-CM | POA: Diagnosis not present

## 2017-01-15 DIAGNOSIS — K5730 Diverticulosis of large intestine without perforation or abscess without bleeding: Secondary | ICD-10-CM | POA: Diagnosis not present

## 2017-01-15 DIAGNOSIS — K635 Polyp of colon: Secondary | ICD-10-CM | POA: Diagnosis not present

## 2017-01-18 DIAGNOSIS — K635 Polyp of colon: Secondary | ICD-10-CM | POA: Diagnosis not present

## 2017-02-13 DIAGNOSIS — E038 Other specified hypothyroidism: Secondary | ICD-10-CM | POA: Diagnosis not present

## 2017-03-15 DIAGNOSIS — S161XXA Strain of muscle, fascia and tendon at neck level, initial encounter: Secondary | ICD-10-CM | POA: Diagnosis not present

## 2017-04-04 DIAGNOSIS — E038 Other specified hypothyroidism: Secondary | ICD-10-CM | POA: Diagnosis not present

## 2017-04-06 DIAGNOSIS — M542 Cervicalgia: Secondary | ICD-10-CM | POA: Diagnosis not present

## 2017-04-19 DIAGNOSIS — M7022 Olecranon bursitis, left elbow: Secondary | ICD-10-CM | POA: Diagnosis not present

## 2017-04-26 ENCOUNTER — Other Ambulatory Visit: Payer: Self-pay | Admitting: Internal Medicine

## 2017-04-26 ENCOUNTER — Ambulatory Visit
Admission: RE | Admit: 2017-04-26 | Discharge: 2017-04-26 | Disposition: A | Discharge status: Home / Self Care | Payer: Medicare Other | Source: Ambulatory Visit | Attending: Internal Medicine | Admitting: Internal Medicine

## 2017-04-26 DIAGNOSIS — M542 Cervicalgia: Secondary | ICD-10-CM

## 2017-04-26 DIAGNOSIS — M47812 Spondylosis without myelopathy or radiculopathy, cervical region: Secondary | ICD-10-CM | POA: Diagnosis not present

## 2017-05-03 ENCOUNTER — Other Ambulatory Visit: Payer: Self-pay | Admitting: Internal Medicine

## 2017-05-03 DIAGNOSIS — M542 Cervicalgia: Secondary | ICD-10-CM

## 2017-05-07 DIAGNOSIS — Z8546 Personal history of malignant neoplasm of prostate: Secondary | ICD-10-CM | POA: Diagnosis not present

## 2017-05-10 DIAGNOSIS — Z8546 Personal history of malignant neoplasm of prostate: Secondary | ICD-10-CM | POA: Diagnosis not present

## 2017-05-10 DIAGNOSIS — J449 Chronic obstructive pulmonary disease, unspecified: Secondary | ICD-10-CM | POA: Diagnosis not present

## 2017-05-10 DIAGNOSIS — R399 Unspecified symptoms and signs involving the genitourinary system: Secondary | ICD-10-CM | POA: Diagnosis not present

## 2017-05-16 ENCOUNTER — Ambulatory Visit
Admission: RE | Admit: 2017-05-16 | Discharge: 2017-05-16 | Disposition: A | Discharge status: Home / Self Care | Payer: Medicare Other | Source: Ambulatory Visit | Attending: Internal Medicine | Admitting: Internal Medicine

## 2017-05-16 DIAGNOSIS — M542 Cervicalgia: Secondary | ICD-10-CM

## 2017-05-16 DIAGNOSIS — M50223 Other cervical disc displacement at C6-C7 level: Secondary | ICD-10-CM | POA: Diagnosis not present

## 2017-05-31 DIAGNOSIS — Z08 Encounter for follow-up examination after completed treatment for malignant neoplasm: Secondary | ICD-10-CM | POA: Diagnosis not present

## 2017-05-31 DIAGNOSIS — Z8546 Personal history of malignant neoplasm of prostate: Secondary | ICD-10-CM | POA: Diagnosis not present

## 2017-06-08 DIAGNOSIS — Z8546 Personal history of malignant neoplasm of prostate: Secondary | ICD-10-CM | POA: Diagnosis not present

## 2017-06-08 DIAGNOSIS — C61 Malignant neoplasm of prostate: Secondary | ICD-10-CM | POA: Diagnosis not present

## 2017-06-13 DIAGNOSIS — Z23 Encounter for immunization: Secondary | ICD-10-CM | POA: Diagnosis not present

## 2017-06-13 DIAGNOSIS — M542 Cervicalgia: Secondary | ICD-10-CM | POA: Diagnosis not present

## 2017-06-13 DIAGNOSIS — M71329 Other bursal cyst, unspecified elbow: Secondary | ICD-10-CM | POA: Diagnosis not present

## 2017-06-13 DIAGNOSIS — E7800 Pure hypercholesterolemia, unspecified: Secondary | ICD-10-CM | POA: Diagnosis not present

## 2017-06-13 DIAGNOSIS — C61 Malignant neoplasm of prostate: Secondary | ICD-10-CM | POA: Diagnosis not present

## 2017-06-13 DIAGNOSIS — Z8546 Personal history of malignant neoplasm of prostate: Secondary | ICD-10-CM | POA: Diagnosis not present

## 2017-06-13 DIAGNOSIS — I10 Essential (primary) hypertension: Secondary | ICD-10-CM | POA: Diagnosis not present

## 2017-06-13 DIAGNOSIS — I700 Atherosclerosis of aorta: Secondary | ICD-10-CM | POA: Diagnosis not present

## 2017-06-13 DIAGNOSIS — N183 Chronic kidney disease, stage 3 (moderate): Secondary | ICD-10-CM | POA: Diagnosis not present

## 2017-06-13 DIAGNOSIS — E039 Hypothyroidism, unspecified: Secondary | ICD-10-CM | POA: Diagnosis not present

## 2017-06-20 DIAGNOSIS — M5030 Other cervical disc degeneration, unspecified cervical region: Secondary | ICD-10-CM | POA: Diagnosis not present

## 2017-06-20 DIAGNOSIS — M542 Cervicalgia: Secondary | ICD-10-CM | POA: Diagnosis not present

## 2017-06-20 DIAGNOSIS — M47812 Spondylosis without myelopathy or radiculopathy, cervical region: Secondary | ICD-10-CM | POA: Diagnosis not present

## 2017-07-05 DIAGNOSIS — M7022 Olecranon bursitis, left elbow: Secondary | ICD-10-CM | POA: Diagnosis not present

## 2017-08-13 DIAGNOSIS — Z8546 Personal history of malignant neoplasm of prostate: Secondary | ICD-10-CM | POA: Diagnosis not present

## 2017-08-16 DIAGNOSIS — Z8546 Personal history of malignant neoplasm of prostate: Secondary | ICD-10-CM | POA: Diagnosis not present

## 2017-10-31 DIAGNOSIS — J449 Chronic obstructive pulmonary disease, unspecified: Secondary | ICD-10-CM | POA: Diagnosis not present

## 2017-10-31 DIAGNOSIS — R911 Solitary pulmonary nodule: Secondary | ICD-10-CM | POA: Diagnosis not present

## 2017-10-31 DIAGNOSIS — Z87891 Personal history of nicotine dependence: Secondary | ICD-10-CM | POA: Diagnosis not present

## 2017-10-31 DIAGNOSIS — Z9989 Dependence on other enabling machines and devices: Secondary | ICD-10-CM | POA: Diagnosis not present

## 2017-10-31 DIAGNOSIS — Z7951 Long term (current) use of inhaled steroids: Secondary | ICD-10-CM | POA: Diagnosis not present

## 2017-10-31 DIAGNOSIS — G4733 Obstructive sleep apnea (adult) (pediatric): Secondary | ICD-10-CM | POA: Diagnosis not present

## 2017-11-02 DIAGNOSIS — N410 Acute prostatitis: Secondary | ICD-10-CM | POA: Diagnosis not present

## 2017-11-02 DIAGNOSIS — R300 Dysuria: Secondary | ICD-10-CM | POA: Diagnosis not present

## 2017-11-27 DIAGNOSIS — J432 Centrilobular emphysema: Secondary | ICD-10-CM | POA: Diagnosis not present

## 2017-11-27 DIAGNOSIS — R911 Solitary pulmonary nodule: Secondary | ICD-10-CM | POA: Diagnosis not present

## 2017-11-27 DIAGNOSIS — I2510 Atherosclerotic heart disease of native coronary artery without angina pectoris: Secondary | ICD-10-CM | POA: Diagnosis not present

## 2017-12-23 DIAGNOSIS — I771 Stricture of artery: Secondary | ICD-10-CM | POA: Diagnosis not present

## 2017-12-23 DIAGNOSIS — R930 Abnormal findings on diagnostic imaging of skull and head, not elsewhere classified: Secondary | ICD-10-CM | POA: Diagnosis not present

## 2017-12-23 DIAGNOSIS — M542 Cervicalgia: Secondary | ICD-10-CM | POA: Diagnosis not present

## 2017-12-23 DIAGNOSIS — M47812 Spondylosis without myelopathy or radiculopathy, cervical region: Secondary | ICD-10-CM | POA: Diagnosis not present

## 2017-12-23 DIAGNOSIS — R55 Syncope and collapse: Secondary | ICD-10-CM | POA: Diagnosis not present

## 2017-12-23 DIAGNOSIS — I10 Essential (primary) hypertension: Secondary | ICD-10-CM | POA: Diagnosis not present

## 2017-12-23 DIAGNOSIS — I517 Cardiomegaly: Secondary | ICD-10-CM | POA: Diagnosis not present

## 2017-12-23 DIAGNOSIS — M5030 Other cervical disc degeneration, unspecified cervical region: Secondary | ICD-10-CM | POA: Diagnosis not present

## 2017-12-23 DIAGNOSIS — R079 Chest pain, unspecified: Secondary | ICD-10-CM | POA: Diagnosis not present

## 2017-12-23 DIAGNOSIS — R001 Bradycardia, unspecified: Secondary | ICD-10-CM | POA: Diagnosis not present

## 2017-12-23 DIAGNOSIS — Z8546 Personal history of malignant neoplasm of prostate: Secondary | ICD-10-CM | POA: Diagnosis not present

## 2017-12-26 DIAGNOSIS — M7061 Trochanteric bursitis, right hip: Secondary | ICD-10-CM | POA: Diagnosis not present

## 2017-12-26 DIAGNOSIS — M25552 Pain in left hip: Secondary | ICD-10-CM | POA: Diagnosis not present

## 2017-12-26 DIAGNOSIS — M7062 Trochanteric bursitis, left hip: Secondary | ICD-10-CM | POA: Diagnosis not present

## 2017-12-26 DIAGNOSIS — M25551 Pain in right hip: Secondary | ICD-10-CM | POA: Diagnosis not present

## 2018-01-09 DIAGNOSIS — E039 Hypothyroidism, unspecified: Secondary | ICD-10-CM | POA: Diagnosis not present

## 2018-01-09 DIAGNOSIS — I700 Atherosclerosis of aorta: Secondary | ICD-10-CM | POA: Diagnosis not present

## 2018-01-09 DIAGNOSIS — N183 Chronic kidney disease, stage 3 (moderate): Secondary | ICD-10-CM | POA: Diagnosis not present

## 2018-01-09 DIAGNOSIS — J449 Chronic obstructive pulmonary disease, unspecified: Secondary | ICD-10-CM | POA: Diagnosis not present

## 2018-01-09 DIAGNOSIS — Z0000 Encounter for general adult medical examination without abnormal findings: Secondary | ICD-10-CM | POA: Diagnosis not present

## 2018-01-09 DIAGNOSIS — I714 Abdominal aortic aneurysm, without rupture: Secondary | ICD-10-CM | POA: Diagnosis not present

## 2018-01-09 DIAGNOSIS — I10 Essential (primary) hypertension: Secondary | ICD-10-CM | POA: Diagnosis not present

## 2018-01-09 DIAGNOSIS — Z1389 Encounter for screening for other disorder: Secondary | ICD-10-CM | POA: Diagnosis not present

## 2018-01-09 DIAGNOSIS — C61 Malignant neoplasm of prostate: Secondary | ICD-10-CM | POA: Diagnosis not present

## 2018-01-09 DIAGNOSIS — E7800 Pure hypercholesterolemia, unspecified: Secondary | ICD-10-CM | POA: Diagnosis not present

## 2018-01-09 DIAGNOSIS — I6529 Occlusion and stenosis of unspecified carotid artery: Secondary | ICD-10-CM | POA: Diagnosis not present

## 2018-01-15 DIAGNOSIS — I951 Orthostatic hypotension: Secondary | ICD-10-CM | POA: Diagnosis not present

## 2018-01-15 DIAGNOSIS — R55 Syncope and collapse: Secondary | ICD-10-CM | POA: Diagnosis not present

## 2018-01-28 DIAGNOSIS — Z8546 Personal history of malignant neoplasm of prostate: Secondary | ICD-10-CM | POA: Diagnosis not present

## 2018-01-30 DIAGNOSIS — R399 Unspecified symptoms and signs involving the genitourinary system: Secondary | ICD-10-CM | POA: Diagnosis not present

## 2018-01-30 DIAGNOSIS — J449 Chronic obstructive pulmonary disease, unspecified: Secondary | ICD-10-CM | POA: Diagnosis not present

## 2018-01-30 DIAGNOSIS — Z8546 Personal history of malignant neoplasm of prostate: Secondary | ICD-10-CM | POA: Diagnosis not present

## 2018-02-08 DIAGNOSIS — E039 Hypothyroidism, unspecified: Secondary | ICD-10-CM | POA: Diagnosis not present

## 2018-02-08 DIAGNOSIS — I951 Orthostatic hypotension: Secondary | ICD-10-CM | POA: Diagnosis not present

## 2018-02-08 DIAGNOSIS — R001 Bradycardia, unspecified: Secondary | ICD-10-CM | POA: Diagnosis not present

## 2018-02-14 DIAGNOSIS — R3915 Urgency of urination: Secondary | ICD-10-CM | POA: Diagnosis not present

## 2018-02-14 DIAGNOSIS — N3941 Urge incontinence: Secondary | ICD-10-CM | POA: Diagnosis not present

## 2018-02-20 DIAGNOSIS — C61 Malignant neoplasm of prostate: Secondary | ICD-10-CM | POA: Diagnosis not present

## 2018-02-20 DIAGNOSIS — R3915 Urgency of urination: Secondary | ICD-10-CM | POA: Diagnosis not present

## 2018-02-21 DIAGNOSIS — C61 Malignant neoplasm of prostate: Secondary | ICD-10-CM | POA: Diagnosis not present

## 2018-02-21 DIAGNOSIS — Z8546 Personal history of malignant neoplasm of prostate: Secondary | ICD-10-CM | POA: Diagnosis not present

## 2018-02-21 DIAGNOSIS — R911 Solitary pulmonary nodule: Secondary | ICD-10-CM | POA: Diagnosis not present

## 2018-02-27 DIAGNOSIS — R3915 Urgency of urination: Secondary | ICD-10-CM | POA: Diagnosis not present

## 2018-03-06 DIAGNOSIS — R3915 Urgency of urination: Secondary | ICD-10-CM | POA: Diagnosis not present

## 2018-03-12 DIAGNOSIS — C61 Malignant neoplasm of prostate: Secondary | ICD-10-CM | POA: Diagnosis not present

## 2018-03-12 DIAGNOSIS — C775 Secondary and unspecified malignant neoplasm of intrapelvic lymph nodes: Secondary | ICD-10-CM | POA: Diagnosis not present

## 2018-03-12 DIAGNOSIS — C7951 Secondary malignant neoplasm of bone: Secondary | ICD-10-CM | POA: Diagnosis not present

## 2018-03-13 DIAGNOSIS — C61 Malignant neoplasm of prostate: Secondary | ICD-10-CM | POA: Diagnosis not present

## 2018-03-13 DIAGNOSIS — R3915 Urgency of urination: Secondary | ICD-10-CM | POA: Diagnosis not present

## 2018-03-20 DIAGNOSIS — R3915 Urgency of urination: Secondary | ICD-10-CM | POA: Diagnosis not present

## 2018-03-20 DIAGNOSIS — C61 Malignant neoplasm of prostate: Secondary | ICD-10-CM | POA: Diagnosis not present

## 2018-03-25 DIAGNOSIS — R350 Frequency of micturition: Secondary | ICD-10-CM | POA: Diagnosis not present

## 2018-03-25 DIAGNOSIS — R300 Dysuria: Secondary | ICD-10-CM | POA: Diagnosis not present

## 2018-03-25 DIAGNOSIS — R399 Unspecified symptoms and signs involving the genitourinary system: Secondary | ICD-10-CM | POA: Diagnosis not present

## 2018-03-26 DIAGNOSIS — N189 Chronic kidney disease, unspecified: Secondary | ICD-10-CM | POA: Diagnosis not present

## 2018-03-26 DIAGNOSIS — J449 Chronic obstructive pulmonary disease, unspecified: Secondary | ICD-10-CM | POA: Diagnosis not present

## 2018-03-26 DIAGNOSIS — R9721 Rising PSA following treatment for malignant neoplasm of prostate: Secondary | ICD-10-CM | POA: Diagnosis not present

## 2018-03-26 DIAGNOSIS — C61 Malignant neoplasm of prostate: Secondary | ICD-10-CM | POA: Diagnosis not present

## 2018-03-26 DIAGNOSIS — I129 Hypertensive chronic kidney disease with stage 1 through stage 4 chronic kidney disease, or unspecified chronic kidney disease: Secondary | ICD-10-CM | POA: Diagnosis not present

## 2018-03-26 DIAGNOSIS — C775 Secondary and unspecified malignant neoplasm of intrapelvic lymph nodes: Secondary | ICD-10-CM | POA: Diagnosis not present

## 2018-03-27 DIAGNOSIS — R350 Frequency of micturition: Secondary | ICD-10-CM | POA: Diagnosis not present

## 2018-04-03 DIAGNOSIS — R3915 Urgency of urination: Secondary | ICD-10-CM | POA: Diagnosis not present

## 2018-04-12 DIAGNOSIS — Z961 Presence of intraocular lens: Secondary | ICD-10-CM | POA: Diagnosis not present

## 2018-04-12 DIAGNOSIS — H52203 Unspecified astigmatism, bilateral: Secondary | ICD-10-CM | POA: Diagnosis not present

## 2018-04-12 DIAGNOSIS — H524 Presbyopia: Secondary | ICD-10-CM | POA: Diagnosis not present

## 2018-04-17 DIAGNOSIS — C61 Malignant neoplasm of prostate: Secondary | ICD-10-CM | POA: Diagnosis not present

## 2018-04-17 DIAGNOSIS — R3915 Urgency of urination: Secondary | ICD-10-CM | POA: Diagnosis not present

## 2018-07-10 DIAGNOSIS — C61 Malignant neoplasm of prostate: Secondary | ICD-10-CM | POA: Diagnosis not present

## 2018-07-11 DIAGNOSIS — C61 Malignant neoplasm of prostate: Secondary | ICD-10-CM | POA: Diagnosis not present

## 2018-07-11 DIAGNOSIS — I10 Essential (primary) hypertension: Secondary | ICD-10-CM | POA: Diagnosis not present

## 2018-07-11 DIAGNOSIS — N183 Chronic kidney disease, stage 3 (moderate): Secondary | ICD-10-CM | POA: Diagnosis not present

## 2018-07-11 DIAGNOSIS — R05 Cough: Secondary | ICD-10-CM | POA: Diagnosis not present

## 2018-07-11 DIAGNOSIS — E7800 Pure hypercholesterolemia, unspecified: Secondary | ICD-10-CM | POA: Diagnosis not present

## 2018-07-11 DIAGNOSIS — E039 Hypothyroidism, unspecified: Secondary | ICD-10-CM | POA: Diagnosis not present

## 2018-07-18 DIAGNOSIS — Z8546 Personal history of malignant neoplasm of prostate: Secondary | ICD-10-CM | POA: Diagnosis not present

## 2018-07-18 DIAGNOSIS — C61 Malignant neoplasm of prostate: Secondary | ICD-10-CM | POA: Diagnosis not present

## 2018-09-09 DIAGNOSIS — Z8546 Personal history of malignant neoplasm of prostate: Secondary | ICD-10-CM | POA: Diagnosis not present

## 2018-09-09 DIAGNOSIS — Z08 Encounter for follow-up examination after completed treatment for malignant neoplasm: Secondary | ICD-10-CM | POA: Diagnosis not present

## 2018-09-13 DIAGNOSIS — R350 Frequency of micturition: Secondary | ICD-10-CM | POA: Diagnosis not present

## 2018-09-13 DIAGNOSIS — R3915 Urgency of urination: Secondary | ICD-10-CM | POA: Diagnosis not present

## 2018-09-13 DIAGNOSIS — R351 Nocturia: Secondary | ICD-10-CM | POA: Diagnosis not present

## 2018-09-13 DIAGNOSIS — Z8546 Personal history of malignant neoplasm of prostate: Secondary | ICD-10-CM | POA: Diagnosis not present

## 2019-01-15 DIAGNOSIS — N289 Disorder of kidney and ureter, unspecified: Secondary | ICD-10-CM | POA: Diagnosis not present

## 2019-01-15 DIAGNOSIS — I129 Hypertensive chronic kidney disease with stage 1 through stage 4 chronic kidney disease, or unspecified chronic kidney disease: Secondary | ICD-10-CM | POA: Diagnosis not present

## 2019-01-15 DIAGNOSIS — N189 Chronic kidney disease, unspecified: Secondary | ICD-10-CM | POA: Diagnosis not present

## 2019-01-15 DIAGNOSIS — C61 Malignant neoplasm of prostate: Secondary | ICD-10-CM | POA: Diagnosis not present

## 2019-01-15 DIAGNOSIS — Z192 Hormone resistant malignancy status: Secondary | ICD-10-CM | POA: Diagnosis not present

## 2019-01-21 DIAGNOSIS — I700 Atherosclerosis of aorta: Secondary | ICD-10-CM | POA: Diagnosis not present

## 2019-01-21 DIAGNOSIS — Z0000 Encounter for general adult medical examination without abnormal findings: Secondary | ICD-10-CM | POA: Diagnosis not present

## 2019-01-21 DIAGNOSIS — Z1389 Encounter for screening for other disorder: Secondary | ICD-10-CM | POA: Diagnosis not present

## 2019-01-21 DIAGNOSIS — I10 Essential (primary) hypertension: Secondary | ICD-10-CM | POA: Diagnosis not present

## 2019-01-21 DIAGNOSIS — E7800 Pure hypercholesterolemia, unspecified: Secondary | ICD-10-CM | POA: Diagnosis not present

## 2019-01-21 DIAGNOSIS — C61 Malignant neoplasm of prostate: Secondary | ICD-10-CM | POA: Diagnosis not present

## 2019-01-21 DIAGNOSIS — N183 Chronic kidney disease, stage 3 (moderate): Secondary | ICD-10-CM | POA: Diagnosis not present

## 2019-01-21 DIAGNOSIS — I714 Abdominal aortic aneurysm, without rupture: Secondary | ICD-10-CM | POA: Diagnosis not present

## 2019-01-21 DIAGNOSIS — E039 Hypothyroidism, unspecified: Secondary | ICD-10-CM | POA: Diagnosis not present

## 2019-01-23 DIAGNOSIS — E7800 Pure hypercholesterolemia, unspecified: Secondary | ICD-10-CM | POA: Diagnosis not present

## 2019-01-23 DIAGNOSIS — E039 Hypothyroidism, unspecified: Secondary | ICD-10-CM | POA: Diagnosis not present

## 2019-02-05 DIAGNOSIS — D225 Melanocytic nevi of trunk: Secondary | ICD-10-CM | POA: Diagnosis not present

## 2019-02-05 DIAGNOSIS — R238 Other skin changes: Secondary | ICD-10-CM | POA: Diagnosis not present

## 2019-02-05 DIAGNOSIS — D485 Neoplasm of uncertain behavior of skin: Secondary | ICD-10-CM | POA: Diagnosis not present

## 2019-02-05 DIAGNOSIS — L814 Other melanin hyperpigmentation: Secondary | ICD-10-CM | POA: Diagnosis not present

## 2019-02-05 DIAGNOSIS — L578 Other skin changes due to chronic exposure to nonionizing radiation: Secondary | ICD-10-CM | POA: Diagnosis not present

## 2019-02-05 DIAGNOSIS — C44319 Basal cell carcinoma of skin of other parts of face: Secondary | ICD-10-CM | POA: Diagnosis not present

## 2019-02-05 DIAGNOSIS — L821 Other seborrheic keratosis: Secondary | ICD-10-CM | POA: Diagnosis not present

## 2019-02-05 DIAGNOSIS — X32XXXA Exposure to sunlight, initial encounter: Secondary | ICD-10-CM | POA: Diagnosis not present

## 2019-02-19 DIAGNOSIS — E538 Deficiency of other specified B group vitamins: Secondary | ICD-10-CM | POA: Diagnosis not present

## 2019-02-19 DIAGNOSIS — R251 Tremor, unspecified: Secondary | ICD-10-CM | POA: Diagnosis not present

## 2019-02-21 DIAGNOSIS — C44319 Basal cell carcinoma of skin of other parts of face: Secondary | ICD-10-CM | POA: Diagnosis not present

## 2019-02-28 DIAGNOSIS — Z4802 Encounter for removal of sutures: Secondary | ICD-10-CM | POA: Diagnosis not present

## 2019-03-05 DIAGNOSIS — Z9989 Dependence on other enabling machines and devices: Secondary | ICD-10-CM | POA: Diagnosis not present

## 2019-03-05 DIAGNOSIS — R9389 Abnormal findings on diagnostic imaging of other specified body structures: Secondary | ICD-10-CM | POA: Diagnosis not present

## 2019-03-05 DIAGNOSIS — M7061 Trochanteric bursitis, right hip: Secondary | ICD-10-CM | POA: Diagnosis not present

## 2019-03-05 DIAGNOSIS — G4733 Obstructive sleep apnea (adult) (pediatric): Secondary | ICD-10-CM | POA: Diagnosis not present

## 2019-03-05 DIAGNOSIS — M25552 Pain in left hip: Secondary | ICD-10-CM | POA: Diagnosis not present

## 2019-03-05 DIAGNOSIS — C61 Malignant neoplasm of prostate: Secondary | ICD-10-CM | POA: Diagnosis not present

## 2019-03-05 DIAGNOSIS — J432 Centrilobular emphysema: Secondary | ICD-10-CM | POA: Diagnosis not present

## 2019-03-05 DIAGNOSIS — M7062 Trochanteric bursitis, left hip: Secondary | ICD-10-CM | POA: Diagnosis not present

## 2019-03-05 DIAGNOSIS — Z87891 Personal history of nicotine dependence: Secondary | ICD-10-CM | POA: Diagnosis not present

## 2019-03-05 DIAGNOSIS — M25551 Pain in right hip: Secondary | ICD-10-CM | POA: Diagnosis not present

## 2019-03-20 DIAGNOSIS — C61 Malignant neoplasm of prostate: Secondary | ICD-10-CM | POA: Diagnosis not present

## 2019-03-20 DIAGNOSIS — R911 Solitary pulmonary nodule: Secondary | ICD-10-CM | POA: Diagnosis not present

## 2019-03-20 DIAGNOSIS — K7689 Other specified diseases of liver: Secondary | ICD-10-CM | POA: Diagnosis not present

## 2019-03-21 DIAGNOSIS — C44319 Basal cell carcinoma of skin of other parts of face: Secondary | ICD-10-CM | POA: Diagnosis not present

## 2019-03-21 DIAGNOSIS — R238 Other skin changes: Secondary | ICD-10-CM | POA: Diagnosis not present

## 2019-03-26 DIAGNOSIS — R3915 Urgency of urination: Secondary | ICD-10-CM | POA: Diagnosis not present

## 2019-04-01 NOTE — Progress Notes (Signed)
Christopher Wilkerson was seen today in the movement disorders clinic for neurologic consultation at the request of Seward Carol, MD.  The consultation is for the evaluation of right hand tremor.  Medical records made available to me have been reviewed.  Wife on phone   Tremor: Yes.     How long has it been going on? "couple months"  At rest or with activation?  rest  Fam hx of tremor?  No.  Located where?  R hand only  Affected by caffeine:  No.  Affected by alcohol:  No. (drinks 3 bourbons per day - no change in tremor with this)  Affected by stress:  Yes.    Affected by fatigue:  Yes.    Spills soup if on spoon:  No.  Spills glass of liquid if full:  No.  Affects ADL's (tying shoes, brushing teeth, etc):  No.  Tremor inducing meds:  Yes.   (albuterol rarely, breo daily)  Other Specific Symptoms:  Voice: "little softer" Sleep: trouble staying asleep - "I have an overactive bladder" - having botox into the bladder next week  Vivid Dreams:  No.  Acting out dreams:  No. Wet Pillows: Yes.  , sometimes Postural symptoms:  No.  Falls?  No., "none in the last year" Bradykinesia symptoms: no bradykinesia noted Loss of smell:  Yes.   Loss of taste:  No. Urinary Incontinence:  Yes.  , sometimes due to urgency/frequency Difficulty Swallowing:  No. Handwriting, micrographia: Yes.   Trouble with ADL's:  No.  Trouble buttoning clothing: No. Depression:  No. Memory changes:  No. Hallucinations:  No.  visual distortions: No. N/V:  No. Lightheaded:  No.  Syncope: No. Diplopia:  No. Dyskinesia:  No.  Neuroimaging of the brain has not previously been performed in the recent years but pt states that he had one in [place] in the 1980's.  He states that he had it because he was told that he had a "smudge" on his CT of the brain and subsequently had an MRI of the brain.  He does not exactly recall the results.  PREVIOUS MEDICATIONS: none to date  ALLERGIES:   Allergies  Allergen Reactions   . Iohexol     CURRENT MEDICATIONS:  Current Outpatient Medications  Medication Instructions  . albuterol (PROVENTIL HFA) 108 (90 BASE) MCG/ACT inhaler 2 puffs, Every 4 hours PRN  . amLODipine (NORVASC) 10 MG tablet Daily  . fluticasone furoate-vilanterol (BREO ELLIPTA) 100-25 MCG/INH AEPB 1 puff, Inhalation, Daily  . levothyroxine (SYNTHROID) 50 mcg, Oral, Daily before breakfast    PAST MEDICAL HISTORY:   Past Medical History:  Diagnosis Date  . Abdominal aortic aneurysm ([place])   . BPH (benign prostatic hyperplasia)   . COPD (chronic obstructive pulmonary disease) ([place])   . Dyslipidemia   . ED (erectile dysfunction)   . Hypertension   . OSA (obstructive sleep apnea)    on cpap faithfully  . Prostate cancer ([place])   . Syncope     PAST SURGICAL HISTORY:   Past Surgical History:  Procedure Laterality Date  . CATARACT EXTRACTION, BILATERAL    . collapsed lung  2006  . PROSTATE CRYOABLATION    . PROSTATE SURGERY    . repair right index finger torn collatral ligament    . vats for spontaneous ptx      SOCIAL HISTORY:   Social History   Socioeconomic History  . Marital status: Married    Spouse name: Not on file  . Number of  children: Not on file  . Years of education: Not on file  . Highest education level: Not on file  Occupational History  . Occupation: retired    Comment: Recruitment consultant  Social Needs  . Financial resource strain: Not on file  . Food insecurity    Worry: Not on file    Inability: Not on file  . Transportation needs    Medical: Not on file    Non-medical: Not on file  Tobacco Use  . Smoking status: Former Smoker    Packs/day: 2.00    Quit date: 01/29/1975    Years since quitting: 44.2  . Smokeless tobacco: Never Used  Substance and Sexual Activity  . Alcohol use: Yes    Comment: 3 glasses bourbon daily  . Drug use: No  . Sexual activity: Not on file  Lifestyle  . Physical activity    Days per week: Not on file    Minutes per  session: Not on file  . Stress: Not on file  Relationships  . Social Herbalist on phone: Not on file    Gets together: Not on file    Attends religious service: Not on file    Active member of club or organization: Not on file    Attends meetings of clubs or organizations: Not on file    Relationship status: Not on file  . Intimate partner violence    Fear of current or ex partner: Not on file    Emotionally abused: Not on file    Physically abused: Not on file    Forced sexual activity: Not on file  Other Topics Concern  . Not on file  Social History Narrative   Right handed      College      Lives with wife    FAMILY HISTORY:   Family Status  Relation Name Status  . Father  Deceased  . Mother  Deceased  . Brother  Deceased  . MGF  (Not Specified)  . Child 2 Alive    ROS:  Review of Systems  Constitutional: Negative.   HENT: Negative.   Eyes: Negative.   Respiratory: Negative.   Cardiovascular: Negative.   Gastrointestinal: Negative.   Genitourinary: Positive for frequency and urgency.  Musculoskeletal: Negative.   Skin: Negative.   Endo/Heme/Allergies: Negative.     PHYSICAL EXAMINATION:    VITALS:   Vitals:   04/03/19 0837  BP: 138/82  Pulse: 69  SpO2: 98%  Weight: 201 lb (91.2 kg)  Height: 5' 10.5" (1.791 m)    GEN:  The patient appears stated age and is in NAD. HEENT:  Normocephalic, atraumatic.  The mucous membranes are moist. The superficial temporal arteries are without ropiness or tenderness. CV:  RRR Lungs:  CTAB Neck/HEME:  There are no carotid bruits bilaterally.  Neurological examination:  Orientation: The patient is alert and oriented x3. Fund of knowledge is appropriate.  Recent and remote memory are intact.  Attention and concentration are normal.    Able to name objects and repeat phrases. Cranial nerves: There is good facial symmetry.  Extraocular muscles are intact. The visual fields are full to confrontational  testing. The speech is fluent and clear. Soft palate rises symmetrically and there is no tongue deviation. Hearing is intact to conversational tone. Sensation: Sensation is intact to light and pinprick throughout (facial, trunk, extremities). Vibration is intact at the bilateral big ankle, although absent at the bilateral big toe. There is no extinction with  double simultaneous stimulation. There is no sensory dermatomal level identified. Motor: Strength is 5/5 in the bilateral upper and lower extremities.   Shoulder shrug is equal and symmetric.  There is no pronator drift. Deep tendon reflexes: Deep tendon reflexes are 1/4 at the bilateral biceps, triceps, brachioradialis, patella and absent at the bilateral achilles. Plantar responses are downgoing bilaterally.  Movement examination: Tone: There is normal tone in the bilateral upper extremities.  The tone in the lower extremities is normal.  Abnormal movements: there is RUE resting tremor. Coordination:  There is no decremation with RAM's, with any form of RAMS, including alternating supination and pronation of the forearm, hand opening and closing, finger taps, heel taps and toe taps. Gait and Station: The patient has no difficulty arising out of a deep-seated chair without the use of the hands. The patient's stride length is good with good arm swing.    Lab work from primary care has been reviewed.  This was drawn on February 19, 2019.  Sodium was 139, potassium 4.2, chloride 102, CO2 30, BUN 17, creatinine 1.98, glucose 99.  Vitamin B12 was found to be low at 155 (injections were then recommended.  TSH was last drawn on January 23, 2019.  TSH was 4.85 and it was recommended that he repeat this in 6 weeks.  ASSESSMENT/PLAN:  1.  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  However, he does certainly have a parkinsonian tremor.  Discussed with him that some of  these patients will never develop Parkinson's disease, but some of them well.  Did not recommend any medication right now, as medications for the tremor generally will cause more side effects than have benefit.  However, I did recommend safe, cardiovascular exercise.  We discussed this in detail.  -Discussed with the patient that should he have any new or increased neurologic signs/symptoms before next visit, then I want him to call me and/or make a follow-up appointment.  -I did go ahead and recommend an MRI of the brain, given that the patient feels that his symptoms have been fairly subacute.  He also reports a history of an abnormal scan in the past, quite remotely.  I did tell him that we will likely see small vessel disease from his history of hypertension and hyperlipidemia.  We will likely see atrophy as well.  Discussed that I want to make sure we are not missing anything else.  He was agreeable.  2.  B12 deficiency  -Patient now on supplementation.  3.  Alcohol use  -Would recommend limiting alcohol, for overall health and wellness.  Currently drinking 3 bourbon per day.  4.  Follow-up 1 year, sooner should new neurologic issues arise.  Cc:  Seward Carol, MD

## 2019-04-02 DIAGNOSIS — I351 Nonrheumatic aortic (valve) insufficiency: Secondary | ICD-10-CM | POA: Diagnosis not present

## 2019-04-02 DIAGNOSIS — C61 Malignant neoplasm of prostate: Secondary | ICD-10-CM | POA: Diagnosis not present

## 2019-04-02 DIAGNOSIS — Z7982 Long term (current) use of aspirin: Secondary | ICD-10-CM | POA: Diagnosis not present

## 2019-04-02 DIAGNOSIS — J449 Chronic obstructive pulmonary disease, unspecified: Secondary | ICD-10-CM | POA: Diagnosis not present

## 2019-04-02 DIAGNOSIS — R001 Bradycardia, unspecified: Secondary | ICD-10-CM | POA: Diagnosis not present

## 2019-04-02 DIAGNOSIS — J439 Emphysema, unspecified: Secondary | ICD-10-CM | POA: Diagnosis not present

## 2019-04-02 DIAGNOSIS — Z923 Personal history of irradiation: Secondary | ICD-10-CM | POA: Diagnosis not present

## 2019-04-02 DIAGNOSIS — Z79899 Other long term (current) drug therapy: Secondary | ICD-10-CM | POA: Diagnosis not present

## 2019-04-02 DIAGNOSIS — N3941 Urge incontinence: Secondary | ICD-10-CM | POA: Diagnosis not present

## 2019-04-02 DIAGNOSIS — Z01818 Encounter for other preprocedural examination: Secondary | ICD-10-CM | POA: Diagnosis not present

## 2019-04-02 DIAGNOSIS — R3915 Urgency of urination: Secondary | ICD-10-CM | POA: Diagnosis not present

## 2019-04-02 DIAGNOSIS — N183 Chronic kidney disease, stage 3 (moderate): Secondary | ICD-10-CM | POA: Diagnosis not present

## 2019-04-02 DIAGNOSIS — Z01810 Encounter for preprocedural cardiovascular examination: Secondary | ICD-10-CM | POA: Diagnosis not present

## 2019-04-02 DIAGNOSIS — R350 Frequency of micturition: Secondary | ICD-10-CM | POA: Diagnosis not present

## 2019-04-02 DIAGNOSIS — G4733 Obstructive sleep apnea (adult) (pediatric): Secondary | ICD-10-CM | POA: Diagnosis not present

## 2019-04-03 ENCOUNTER — Encounter: Payer: Self-pay | Admitting: Neurology

## 2019-04-03 ENCOUNTER — Other Ambulatory Visit: Payer: Self-pay

## 2019-04-03 ENCOUNTER — Ambulatory Visit (INDEPENDENT_AMBULATORY_CARE_PROVIDER_SITE_OTHER): Payer: Medicare Other | Admitting: Neurology

## 2019-04-03 VITALS — BP 138/82 | HR 69 | Ht 70.5 in | Wt 201.0 lb

## 2019-04-03 DIAGNOSIS — Z7289 Other problems related to lifestyle: Secondary | ICD-10-CM

## 2019-04-03 DIAGNOSIS — I10 Essential (primary) hypertension: Secondary | ICD-10-CM | POA: Diagnosis not present

## 2019-04-03 DIAGNOSIS — E785 Hyperlipidemia, unspecified: Secondary | ICD-10-CM | POA: Diagnosis not present

## 2019-04-03 DIAGNOSIS — R9402 Abnormal brain scan: Secondary | ICD-10-CM | POA: Diagnosis not present

## 2019-04-03 DIAGNOSIS — R3915 Urgency of urination: Secondary | ICD-10-CM | POA: Diagnosis not present

## 2019-04-03 DIAGNOSIS — Z789 Other specified health status: Secondary | ICD-10-CM

## 2019-04-03 DIAGNOSIS — Z20828 Contact with and (suspected) exposure to other viral communicable diseases: Secondary | ICD-10-CM | POA: Diagnosis not present

## 2019-04-03 DIAGNOSIS — R251 Tremor, unspecified: Secondary | ICD-10-CM | POA: Diagnosis not present

## 2019-04-03 DIAGNOSIS — E538 Deficiency of other specified B group vitamins: Secondary | ICD-10-CM | POA: Diagnosis not present

## 2019-04-03 DIAGNOSIS — Z01812 Encounter for preprocedural laboratory examination: Secondary | ICD-10-CM | POA: Diagnosis not present

## 2019-04-03 NOTE — Patient Instructions (Addendum)
It was good to see you today.  As we discussed, you have Parkinsonian tremor but not Parkinsons disease.  If you have new symptoms before next visit, please call me and let me know.  I recommend safe, cardiovascular exercise.  I recommend limiting your alcohol intake.    The physicians and staff at [facility] are committed to providing excellent care. You may receive a survey requesting feedback about your experience at our office. We strive to receive "very good" responses to the survey questions. If you feel that your experience would prevent you from giving the office a "very good " response, please contact our office to try to remedy the situation. We may be reached at [phone]. Thank you for taking the time out of your busy day to complete the survey.  You will be scheduled for a MRI of the Brain with [facility]. They will contact you with an appointment date and time. If you wish to call them to schedule you may do so. They may be reached at [phone]. Their location is [place]. [place] [phone].  Thank you.

## 2019-04-10 DIAGNOSIS — Z20828 Contact with and (suspected) exposure to other viral communicable diseases: Secondary | ICD-10-CM | POA: Diagnosis not present

## 2019-04-14 DIAGNOSIS — Z961 Presence of intraocular lens: Secondary | ICD-10-CM | POA: Diagnosis not present

## 2019-04-14 DIAGNOSIS — H04223 Epiphora due to insufficient drainage, bilateral lacrimal glands: Secondary | ICD-10-CM | POA: Diagnosis not present

## 2019-04-22 ENCOUNTER — Other Ambulatory Visit: Payer: Self-pay

## 2019-04-22 ENCOUNTER — Ambulatory Visit
Admission: RE | Admit: 2019-04-22 | Discharge: 2019-04-22 | Disposition: A | Discharge status: Home / Self Care | Payer: Medicare Other | Source: Ambulatory Visit | Attending: Neurology | Admitting: Neurology

## 2019-04-22 DIAGNOSIS — R9402 Abnormal brain scan: Secondary | ICD-10-CM

## 2019-04-22 DIAGNOSIS — R251 Tremor, unspecified: Secondary | ICD-10-CM | POA: Diagnosis not present

## 2019-05-02 DIAGNOSIS — Z01812 Encounter for preprocedural laboratory examination: Secondary | ICD-10-CM | POA: Diagnosis not present

## 2019-05-02 DIAGNOSIS — R3915 Urgency of urination: Secondary | ICD-10-CM | POA: Diagnosis not present

## 2019-05-02 DIAGNOSIS — Z20828 Contact with and (suspected) exposure to other viral communicable diseases: Secondary | ICD-10-CM | POA: Diagnosis not present

## 2019-05-07 DIAGNOSIS — R350 Frequency of micturition: Secondary | ICD-10-CM | POA: Diagnosis not present

## 2019-05-07 DIAGNOSIS — R351 Nocturia: Secondary | ICD-10-CM | POA: Diagnosis not present

## 2019-05-12 DIAGNOSIS — R238 Other skin changes: Secondary | ICD-10-CM | POA: Diagnosis not present

## 2019-05-12 DIAGNOSIS — D485 Neoplasm of uncertain behavior of skin: Secondary | ICD-10-CM | POA: Diagnosis not present

## 2019-05-12 DIAGNOSIS — L570 Actinic keratosis: Secondary | ICD-10-CM | POA: Diagnosis not present

## 2019-05-12 DIAGNOSIS — L538 Other specified erythematous conditions: Secondary | ICD-10-CM | POA: Diagnosis not present

## 2019-05-12 DIAGNOSIS — L578 Other skin changes due to chronic exposure to nonionizing radiation: Secondary | ICD-10-CM | POA: Diagnosis not present

## 2019-05-12 DIAGNOSIS — L820 Inflamed seborrheic keratosis: Secondary | ICD-10-CM | POA: Diagnosis not present

## 2019-05-12 DIAGNOSIS — X32XXXA Exposure to sunlight, initial encounter: Secondary | ICD-10-CM | POA: Diagnosis not present

## 2019-05-12 DIAGNOSIS — C44212 Basal cell carcinoma of skin of right ear and external auricular canal: Secondary | ICD-10-CM | POA: Diagnosis not present

## 2019-05-12 DIAGNOSIS — C44311 Basal cell carcinoma of skin of nose: Secondary | ICD-10-CM | POA: Diagnosis not present

## 2019-05-13 DIAGNOSIS — R3915 Urgency of urination: Secondary | ICD-10-CM | POA: Diagnosis not present

## 2019-05-15 DIAGNOSIS — E039 Hypothyroidism, unspecified: Secondary | ICD-10-CM | POA: Diagnosis not present

## 2019-05-15 DIAGNOSIS — E7800 Pure hypercholesterolemia, unspecified: Secondary | ICD-10-CM | POA: Diagnosis not present

## 2019-05-15 DIAGNOSIS — E782 Mixed hyperlipidemia: Secondary | ICD-10-CM | POA: Diagnosis not present

## 2019-05-15 DIAGNOSIS — C61 Malignant neoplasm of prostate: Secondary | ICD-10-CM | POA: Diagnosis not present

## 2019-05-15 DIAGNOSIS — J449 Chronic obstructive pulmonary disease, unspecified: Secondary | ICD-10-CM | POA: Diagnosis not present

## 2019-05-15 DIAGNOSIS — I10 Essential (primary) hypertension: Secondary | ICD-10-CM | POA: Diagnosis not present

## 2019-05-15 DIAGNOSIS — N1831 Chronic kidney disease, stage 3a: Secondary | ICD-10-CM | POA: Diagnosis not present

## 2019-05-16 DIAGNOSIS — C44212 Basal cell carcinoma of skin of right ear and external auricular canal: Secondary | ICD-10-CM | POA: Diagnosis not present

## 2019-05-19 DIAGNOSIS — R339 Retention of urine, unspecified: Secondary | ICD-10-CM | POA: Diagnosis not present

## 2019-05-19 DIAGNOSIS — Z466 Encounter for fitting and adjustment of urinary device: Secondary | ICD-10-CM | POA: Diagnosis not present

## 2019-05-21 DIAGNOSIS — L538 Other specified erythematous conditions: Secondary | ICD-10-CM | POA: Diagnosis not present

## 2019-05-21 DIAGNOSIS — L089 Local infection of the skin and subcutaneous tissue, unspecified: Secondary | ICD-10-CM | POA: Diagnosis not present

## 2019-05-21 DIAGNOSIS — L820 Inflamed seborrheic keratosis: Secondary | ICD-10-CM | POA: Diagnosis not present

## 2019-05-23 DIAGNOSIS — C44212 Basal cell carcinoma of skin of right ear and external auricular canal: Secondary | ICD-10-CM | POA: Diagnosis not present

## 2019-05-23 DIAGNOSIS — Z4802 Encounter for removal of sutures: Secondary | ICD-10-CM | POA: Diagnosis not present

## 2019-05-23 DIAGNOSIS — Z483 Aftercare following surgery for neoplasm: Secondary | ICD-10-CM | POA: Diagnosis not present

## 2019-05-23 DIAGNOSIS — R339 Retention of urine, unspecified: Secondary | ICD-10-CM | POA: Diagnosis not present

## 2019-06-03 DIAGNOSIS — C44519 Basal cell carcinoma of skin of other part of trunk: Secondary | ICD-10-CM | POA: Diagnosis not present

## 2019-06-03 DIAGNOSIS — C44319 Basal cell carcinoma of skin of other parts of face: Secondary | ICD-10-CM | POA: Diagnosis not present

## 2019-06-03 DIAGNOSIS — R238 Other skin changes: Secondary | ICD-10-CM | POA: Diagnosis not present

## 2019-06-03 DIAGNOSIS — C44310 Basal cell carcinoma of skin of unspecified parts of face: Secondary | ICD-10-CM | POA: Diagnosis not present

## 2019-06-18 DIAGNOSIS — C44311 Basal cell carcinoma of skin of nose: Secondary | ICD-10-CM | POA: Diagnosis not present

## 2019-06-25 DIAGNOSIS — C44311 Basal cell carcinoma of skin of nose: Secondary | ICD-10-CM | POA: Diagnosis not present

## 2019-06-25 DIAGNOSIS — Z483 Aftercare following surgery for neoplasm: Secondary | ICD-10-CM | POA: Diagnosis not present

## 2019-06-25 DIAGNOSIS — Z4802 Encounter for removal of sutures: Secondary | ICD-10-CM | POA: Diagnosis not present

## 2019-06-30 DIAGNOSIS — N1831 Chronic kidney disease, stage 3a: Secondary | ICD-10-CM | POA: Diagnosis not present

## 2019-06-30 DIAGNOSIS — E7800 Pure hypercholesterolemia, unspecified: Secondary | ICD-10-CM | POA: Diagnosis not present

## 2019-06-30 DIAGNOSIS — C61 Malignant neoplasm of prostate: Secondary | ICD-10-CM | POA: Diagnosis not present

## 2019-06-30 DIAGNOSIS — J449 Chronic obstructive pulmonary disease, unspecified: Secondary | ICD-10-CM | POA: Diagnosis not present

## 2019-06-30 DIAGNOSIS — E039 Hypothyroidism, unspecified: Secondary | ICD-10-CM | POA: Diagnosis not present

## 2019-06-30 DIAGNOSIS — I10 Essential (primary) hypertension: Secondary | ICD-10-CM | POA: Diagnosis not present

## 2019-06-30 DIAGNOSIS — E782 Mixed hyperlipidemia: Secondary | ICD-10-CM | POA: Diagnosis not present

## 2019-07-14 DIAGNOSIS — X32XXXA Exposure to sunlight, initial encounter: Secondary | ICD-10-CM | POA: Diagnosis not present

## 2019-07-14 DIAGNOSIS — L570 Actinic keratosis: Secondary | ICD-10-CM | POA: Diagnosis not present

## 2019-07-14 DIAGNOSIS — L821 Other seborrheic keratosis: Secondary | ICD-10-CM | POA: Diagnosis not present

## 2019-07-14 DIAGNOSIS — D485 Neoplasm of uncertain behavior of skin: Secondary | ICD-10-CM | POA: Diagnosis not present

## 2019-08-05 DIAGNOSIS — E7800 Pure hypercholesterolemia, unspecified: Secondary | ICD-10-CM | POA: Diagnosis not present

## 2019-08-05 DIAGNOSIS — E538 Deficiency of other specified B group vitamins: Secondary | ICD-10-CM | POA: Diagnosis not present

## 2019-08-05 DIAGNOSIS — R251 Tremor, unspecified: Secondary | ICD-10-CM | POA: Diagnosis not present

## 2019-08-05 DIAGNOSIS — N1832 Chronic kidney disease, stage 3b: Secondary | ICD-10-CM | POA: Diagnosis not present

## 2019-08-05 DIAGNOSIS — E039 Hypothyroidism, unspecified: Secondary | ICD-10-CM | POA: Diagnosis not present

## 2019-08-05 DIAGNOSIS — I10 Essential (primary) hypertension: Secondary | ICD-10-CM | POA: Diagnosis not present

## 2019-08-06 DIAGNOSIS — Z4802 Encounter for removal of sutures: Secondary | ICD-10-CM | POA: Diagnosis not present

## 2019-08-14 DIAGNOSIS — C44229 Squamous cell carcinoma of skin of left ear and external auricular canal: Secondary | ICD-10-CM | POA: Diagnosis not present

## 2019-08-14 DIAGNOSIS — R238 Other skin changes: Secondary | ICD-10-CM | POA: Diagnosis not present

## 2019-08-28 DIAGNOSIS — L570 Actinic keratosis: Secondary | ICD-10-CM | POA: Diagnosis not present

## 2019-08-28 DIAGNOSIS — C4442 Squamous cell carcinoma of skin of scalp and neck: Secondary | ICD-10-CM | POA: Diagnosis not present

## 2019-08-28 DIAGNOSIS — X32XXXA Exposure to sunlight, initial encounter: Secondary | ICD-10-CM | POA: Diagnosis not present

## 2019-09-07 DIAGNOSIS — J9601 Acute respiratory failure with hypoxia: Secondary | ICD-10-CM | POA: Diagnosis not present

## 2019-09-07 DIAGNOSIS — I517 Cardiomegaly: Secondary | ICD-10-CM | POA: Diagnosis not present

## 2019-09-07 DIAGNOSIS — I080 Rheumatic disorders of both mitral and aortic valves: Secondary | ICD-10-CM | POA: Diagnosis not present

## 2019-09-07 DIAGNOSIS — E876 Hypokalemia: Secondary | ICD-10-CM | POA: Diagnosis not present

## 2019-09-07 DIAGNOSIS — Z87891 Personal history of nicotine dependence: Secondary | ICD-10-CM | POA: Diagnosis not present

## 2019-09-07 DIAGNOSIS — N319 Neuromuscular dysfunction of bladder, unspecified: Secondary | ICD-10-CM | POA: Diagnosis present

## 2019-09-07 DIAGNOSIS — N179 Acute kidney failure, unspecified: Secondary | ICD-10-CM | POA: Diagnosis not present

## 2019-09-07 DIAGNOSIS — J189 Pneumonia, unspecified organism: Secondary | ICD-10-CM | POA: Diagnosis not present

## 2019-09-07 DIAGNOSIS — R7989 Other specified abnormal findings of blood chemistry: Secondary | ICD-10-CM | POA: Diagnosis not present

## 2019-09-07 DIAGNOSIS — N184 Chronic kidney disease, stage 4 (severe): Secondary | ICD-10-CM | POA: Diagnosis not present

## 2019-09-07 DIAGNOSIS — R0602 Shortness of breath: Secondary | ICD-10-CM | POA: Diagnosis not present

## 2019-09-07 DIAGNOSIS — R110 Nausea: Secondary | ICD-10-CM | POA: Diagnosis not present

## 2019-09-07 DIAGNOSIS — R918 Other nonspecific abnormal finding of lung field: Secondary | ICD-10-CM | POA: Diagnosis not present

## 2019-09-07 DIAGNOSIS — N2889 Other specified disorders of kidney and ureter: Secondary | ICD-10-CM | POA: Diagnosis not present

## 2019-09-07 DIAGNOSIS — J156 Pneumonia due to other aerobic Gram-negative bacteria: Secondary | ICD-10-CM | POA: Diagnosis not present

## 2019-09-07 DIAGNOSIS — J439 Emphysema, unspecified: Secondary | ICD-10-CM | POA: Diagnosis not present

## 2019-09-07 DIAGNOSIS — I10 Essential (primary) hypertension: Secondary | ICD-10-CM | POA: Diagnosis not present

## 2019-09-07 DIAGNOSIS — I700 Atherosclerosis of aorta: Secondary | ICD-10-CM | POA: Diagnosis not present

## 2019-09-07 DIAGNOSIS — J9811 Atelectasis: Secondary | ICD-10-CM | POA: Diagnosis not present

## 2019-09-07 DIAGNOSIS — K7689 Other specified diseases of liver: Secondary | ICD-10-CM | POA: Diagnosis not present

## 2019-09-07 DIAGNOSIS — Z79899 Other long term (current) drug therapy: Secondary | ICD-10-CM | POA: Diagnosis not present

## 2019-09-07 DIAGNOSIS — R5381 Other malaise: Secondary | ICD-10-CM | POA: Diagnosis present

## 2019-09-07 DIAGNOSIS — G20 Parkinson's disease: Secondary | ICD-10-CM | POA: Diagnosis present

## 2019-09-07 DIAGNOSIS — A419 Sepsis, unspecified organism: Secondary | ICD-10-CM | POA: Diagnosis not present

## 2019-09-07 DIAGNOSIS — Z20822 Contact with and (suspected) exposure to covid-19: Secondary | ICD-10-CM | POA: Diagnosis not present

## 2019-09-07 DIAGNOSIS — I129 Hypertensive chronic kidney disease with stage 1 through stage 4 chronic kidney disease, or unspecified chronic kidney disease: Secondary | ICD-10-CM | POA: Diagnosis not present

## 2019-09-07 DIAGNOSIS — I714 Abdominal aortic aneurysm, without rupture: Secondary | ICD-10-CM | POA: Diagnosis not present

## 2019-09-07 DIAGNOSIS — M791 Myalgia, unspecified site: Secondary | ICD-10-CM | POA: Diagnosis not present

## 2019-09-07 DIAGNOSIS — N3000 Acute cystitis without hematuria: Secondary | ICD-10-CM | POA: Diagnosis not present

## 2019-09-07 DIAGNOSIS — R05 Cough: Secondary | ICD-10-CM | POA: Diagnosis not present

## 2019-09-07 DIAGNOSIS — Z8546 Personal history of malignant neoplasm of prostate: Secondary | ICD-10-CM | POA: Diagnosis not present

## 2019-09-07 DIAGNOSIS — N400 Enlarged prostate without lower urinary tract symptoms: Secondary | ICD-10-CM | POA: Diagnosis not present

## 2019-09-07 DIAGNOSIS — A4151 Sepsis due to Escherichia coli [E. coli]: Secondary | ICD-10-CM | POA: Diagnosis not present

## 2019-09-07 DIAGNOSIS — R778 Other specified abnormalities of plasma proteins: Secondary | ICD-10-CM | POA: Diagnosis not present

## 2019-09-07 DIAGNOSIS — R531 Weakness: Secondary | ICD-10-CM | POA: Diagnosis not present

## 2019-09-07 DIAGNOSIS — E039 Hypothyroidism, unspecified: Secondary | ICD-10-CM | POA: Diagnosis not present

## 2019-09-07 DIAGNOSIS — R61 Generalized hyperhidrosis: Secondary | ICD-10-CM | POA: Diagnosis not present

## 2019-09-08 DIAGNOSIS — J156 Pneumonia due to other aerobic Gram-negative bacteria: Secondary | ICD-10-CM | POA: Diagnosis not present

## 2019-09-09 DIAGNOSIS — J156 Pneumonia due to other aerobic Gram-negative bacteria: Secondary | ICD-10-CM | POA: Diagnosis not present

## 2019-09-19 DIAGNOSIS — N184 Chronic kidney disease, stage 4 (severe): Secondary | ICD-10-CM | POA: Diagnosis not present

## 2019-09-19 DIAGNOSIS — C61 Malignant neoplasm of prostate: Secondary | ICD-10-CM | POA: Diagnosis not present

## 2019-09-19 DIAGNOSIS — N390 Urinary tract infection, site not specified: Secondary | ICD-10-CM | POA: Diagnosis not present

## 2019-09-19 DIAGNOSIS — J189 Pneumonia, unspecified organism: Secondary | ICD-10-CM | POA: Diagnosis not present

## 2019-09-19 DIAGNOSIS — A419 Sepsis, unspecified organism: Secondary | ICD-10-CM | POA: Diagnosis not present

## 2019-09-23 DIAGNOSIS — N183 Chronic kidney disease, stage 3 unspecified: Secondary | ICD-10-CM | POA: Diagnosis not present

## 2019-09-23 DIAGNOSIS — N184 Chronic kidney disease, stage 4 (severe): Secondary | ICD-10-CM | POA: Diagnosis not present

## 2019-09-23 DIAGNOSIS — C61 Malignant neoplasm of prostate: Secondary | ICD-10-CM | POA: Diagnosis not present

## 2019-09-23 DIAGNOSIS — J449 Chronic obstructive pulmonary disease, unspecified: Secondary | ICD-10-CM | POA: Diagnosis not present

## 2019-09-23 DIAGNOSIS — E7800 Pure hypercholesterolemia, unspecified: Secondary | ICD-10-CM | POA: Diagnosis not present

## 2019-09-23 DIAGNOSIS — E039 Hypothyroidism, unspecified: Secondary | ICD-10-CM | POA: Diagnosis not present

## 2019-09-23 DIAGNOSIS — E782 Mixed hyperlipidemia: Secondary | ICD-10-CM | POA: Diagnosis not present

## 2019-09-23 DIAGNOSIS — I10 Essential (primary) hypertension: Secondary | ICD-10-CM | POA: Diagnosis not present

## 2019-09-23 NOTE — Progress Notes (Signed)
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  Pt reports that tremor is much worse.  Tremor is only in the R hand.  It sometimes interferes with picking up wine/tea.  Walks dog for exercise.  Rides stationary bike 10-15 min per day for exercise.  My previous records were reviewed prior to todays visit. Pt denies falls.  Pt denies lightheadedness, near syncope.  No hallucinations.  In regards to EtOH, reports that has not had EtOH since hospital stay.  Prior to that was 2 drinks per day.  Patient had an MRI of the brain since last visit.  I personally reviewed this.  There is mild to moderate atrophy.  I reviewed other medical record since our last visit.  Patient was just hospitalized at [facility] for fever and pneumonia.  He was discharged on February 16.  Current prescribed movement disorder medications: None   PREVIOUS MEDICATIONS: none to date  ALLERGIES:   Allergies  Allergen Reactions  . Adhesive  [Tape] Other (See Comments)    OK with anything BUT paper tape: skin tears - Coban ok to use   . Iodine   . Iohexol     CURRENT MEDICATIONS:  Outpatient Encounter Medications as of 09/25/2019  Medication Sig  . albuterol (PROVENTIL HFA) 108 (90 BASE) MCG/ACT inhaler Inhale 2 puffs into the lungs every 4 (four) hours as needed.    Marland Kitchen amLODipine (NORVASC) 10 MG tablet Take by mouth Daily.  . fluticasone (FLONASE) 50 MCG/ACT nasal spray Place 1 spray into both nostrils daily as needed for allergies or rhinitis.  . fluticasone furoate-vilanterol (BREO ELLIPTA) 100-25 MCG/INH AEPB Inhale 1 puff into the lungs daily.  Marland Kitchen levothyroxine (SYNTHROID) 50 MCG tablet Take 50 mcg by mouth daily before breakfast.   No facility-administered encounter medications on file as of 09/25/2019.    PHYSICAL EXAMINATION:    VITALS:   Vitals:   09/25/19 0806  BP: (!) 142/74  Pulse: (!) 54  SpO2: 90%  Weight: 190 lb (86.2 kg)  Height: 5' 10.5" (1.791 m)      GEN:  The patient appears stated age and is in NAD. HEENT:  Normocephalic, atraumatic.  The mucous membranes are moist. The superficial temporal arteries are without ropiness or tenderness. CV:  RRR Lungs:  CTAB Neck/HEME:  There are no carotid bruits bilaterally.  Neurological examination:  Orientation: The patient is alert and oriented x3. Cranial nerves: There is good facial symmetry with minimal facial hypomimia. The speech is fluent and clear. Soft palate rises symmetrically and there is no tongue deviation. Hearing is intact to conversational tone. Sensation: Sensation is intact to light touch throughout Motor: Strength is at least antigravity x4.  Movement examination: Tone: There is mild increased tone in the RUE, only with activation procedures Abnormal movements: there is mod RUE rest tremor, near constant Coordination:  There is  decremation with RAM's, only with hand opening and closing on the right.  All other RAMs are normal Gait and Station: The patient has no difficulty arising out of a deep-seated chair without the use of the hands. The patient's stride length is good with decreased arm swing on the R.    Lab work was reviewed and interpreted by me.  Note that lab work was completed while in the hospital for sepsis/pneumonia Lab work was completed on February 15.  Sodium was 143, potassium 3.1 (prior 3.3) chloride 107, CO2 27, BUN 28, creatinine 2.92 (improved from 3.4).  On February 10, patient's white blood cell was 7.3 (was 15.1 on February 8).  ASSESSMENT/PLAN:  1.  Parkinsons Disease  -pt now meeting criteria for Parkinsons Disease  -After some discussion, we decided to start carbidopa/levodopa 25/100 and work to 1 tablet 3 times per day at 7 AM/11 AM/4 PM.  He was given a titration schedule.  R/B/SE were discussed.  The opportunity to ask questions was given and they were answered to the best of my ability.  The patient expressed understanding and willingness to  follow the outlined treatment protocols.  -pt asked about surgical options.  Discussed in detail focused ultrasound versus DBS therapy.  He does not meet criteria for either right now since he has not tried any medication.  Discussed both of these therapies and the differences between.  2.  Recent hospitalization for pneumonia  -Patient was recently hospitalized for pneumonia.  He was discharged on February 16.  While in the hospital, he had acute renal failure.  Renal failure was improving after discharge, although creatinine was still 2.9 upon discharge.  He will follow-up with primary care.  3.  Alcohol use  -would recommend limiting alcohol to no more than 2 days per week given the fact that levodopa is metabolized through the liver and we will need good liver health for the rest of his life.  Total time spent on today's visit was 40 minutes, including both face-to-face time and nonface-to-face time.  Time included that spent on review of records (prior notes available to me/labs/imaging if pertinent), discussing treatment and goals, answering patient's questions and coordinating care.  Cc:  Seward Carol, MD

## 2019-09-24 DIAGNOSIS — N184 Chronic kidney disease, stage 4 (severe): Secondary | ICD-10-CM | POA: Diagnosis not present

## 2019-09-24 DIAGNOSIS — I10 Essential (primary) hypertension: Secondary | ICD-10-CM | POA: Diagnosis not present

## 2019-09-24 DIAGNOSIS — I714 Abdominal aortic aneurysm, without rupture: Secondary | ICD-10-CM | POA: Diagnosis not present

## 2019-09-24 DIAGNOSIS — R0989 Other specified symptoms and signs involving the circulatory and respiratory systems: Secondary | ICD-10-CM | POA: Diagnosis not present

## 2019-09-24 DIAGNOSIS — N179 Acute kidney failure, unspecified: Secondary | ICD-10-CM | POA: Diagnosis not present

## 2019-09-25 ENCOUNTER — Other Ambulatory Visit: Payer: Self-pay

## 2019-09-25 ENCOUNTER — Encounter: Payer: Self-pay | Admitting: Neurology

## 2019-09-25 ENCOUNTER — Ambulatory Visit (INDEPENDENT_AMBULATORY_CARE_PROVIDER_SITE_OTHER): Payer: Medicare Other | Admitting: Neurology

## 2019-09-25 VITALS — BP 142/74 | HR 54 | Ht 70.5 in | Wt 190.0 lb

## 2019-09-25 DIAGNOSIS — Z7289 Other problems related to lifestyle: Secondary | ICD-10-CM | POA: Diagnosis not present

## 2019-09-25 DIAGNOSIS — G20 Parkinson's disease: Secondary | ICD-10-CM

## 2019-09-25 DIAGNOSIS — Z789 Other specified health status: Secondary | ICD-10-CM

## 2019-09-25 MED ORDER — CARBIDOPA-LEVODOPA 25-100 MG PO TABS: 1.0000 | ORAL_TABLET | Freq: Three times a day (TID) | ORAL | 1 refills | Status: DC

## 2019-09-25 NOTE — Patient Instructions (Signed)
Start Carbidopa Levodopa as follows: Take 1/2 tablet three times daily, at least 30 minutes before meals (approximately 7am/11am/4pm), for one week Then take 1/2 tablet in the morning, 1/2 tablet in the afternoon, 1 tablet in the evening, at least 30 minutes before meals, for one week Then take 1/2 tablet in the morning, 1 tablet in the afternoon, 1 tablet in the evening, at least 30 minutes before meals, for one week Then take 1 tablet three times daily at 7am/11am/4pm, at least 30 minutes before meals   As a reminder, carbidopa/levodopa can be taken at the same time as a carbohydrate, but we like to have you take your pill either 30 minutes before a protein source or 1 hour after as protein can interfere with carbidopa/levodopa absorption.  

## 2019-10-07 DIAGNOSIS — Z192 Hormone resistant malignancy status: Secondary | ICD-10-CM | POA: Diagnosis not present

## 2019-10-07 DIAGNOSIS — C61 Malignant neoplasm of prostate: Secondary | ICD-10-CM | POA: Diagnosis not present

## 2019-10-07 DIAGNOSIS — N189 Chronic kidney disease, unspecified: Secondary | ICD-10-CM | POA: Diagnosis not present

## 2019-10-23 DIAGNOSIS — C61 Malignant neoplasm of prostate: Secondary | ICD-10-CM | POA: Diagnosis not present

## 2019-11-10 ENCOUNTER — Telehealth: Payer: Self-pay | Admitting: Neurology

## 2019-11-10 NOTE — Telephone Encounter (Signed)
Pt called to get more information no answer voice mail left for pt to call back

## 2019-11-10 NOTE — Telephone Encounter (Signed)
Pt states that he is not seeing any improvement on the Caridopa levodopa  Please call patient   He left message on the VM

## 2019-11-10 NOTE — Telephone Encounter (Signed)
Tell him as long as no SE, stay on it until I see him next visit and we will discuss more then

## 2019-11-10 NOTE — Telephone Encounter (Signed)
Pt called back no answer voice mail left for pt to call back

## 2019-11-11 DIAGNOSIS — R399 Unspecified symptoms and signs involving the genitourinary system: Secondary | ICD-10-CM | POA: Diagnosis not present

## 2019-11-11 DIAGNOSIS — N3000 Acute cystitis without hematuria: Secondary | ICD-10-CM | POA: Diagnosis not present

## 2019-11-11 NOTE — Telephone Encounter (Signed)
Please advise 

## 2019-11-11 NOTE — Telephone Encounter (Signed)
The following message was left with [facility] on 11/11/19 at 12:31 PM.  Caller states he has been on carbidopa-levodopa for a month and a half and he has no improvement in the tremor in his hand and is needing to know what this next step should be.

## 2019-11-11 NOTE — Telephone Encounter (Signed)
I addressed this yesterday.  See yesterdays phone call

## 2019-11-11 NOTE — Telephone Encounter (Signed)
Left message on patients voicemail to call in

## 2019-11-12 NOTE — Telephone Encounter (Signed)
Pt called back no answer voice mail left per DPR that as long as no SE, stay on it until I see him next visit and we will discuss more then. Any question you can call the office back

## 2019-11-12 NOTE — Telephone Encounter (Signed)
Patient called again and left a message stating the carbidopa-levodopa is not working.

## 2019-12-12 DIAGNOSIS — M25551 Pain in right hip: Secondary | ICD-10-CM | POA: Diagnosis not present

## 2019-12-12 DIAGNOSIS — M7062 Trochanteric bursitis, left hip: Secondary | ICD-10-CM | POA: Diagnosis not present

## 2019-12-12 DIAGNOSIS — M25552 Pain in left hip: Secondary | ICD-10-CM | POA: Diagnosis not present

## 2019-12-12 DIAGNOSIS — M7061 Trochanteric bursitis, right hip: Secondary | ICD-10-CM | POA: Diagnosis not present

## 2019-12-22 DIAGNOSIS — N184 Chronic kidney disease, stage 4 (severe): Secondary | ICD-10-CM | POA: Diagnosis not present

## 2019-12-22 DIAGNOSIS — N179 Acute kidney failure, unspecified: Secondary | ICD-10-CM | POA: Diagnosis not present

## 2020-01-09 DIAGNOSIS — R42 Dizziness and giddiness: Secondary | ICD-10-CM | POA: Diagnosis not present

## 2020-01-27 NOTE — Progress Notes (Deleted)
Assessment/Plan:   1.  Parkinsons Disease  -***   Subjective:   Christopher Wilkerson was seen today in follow up for Parkinsons disease.  My previous records were reviewed prior to todays visit as well as outside records available to me.  I have also reviewed patient's phone calls as well as messages to me.  We started him on levodopa last visit.  He called me and stated that it was not helping.  We called him back, he did not answer, but we left a voicemail for him that it about 30% of patients, medication does not help the tremor.  I recommended that he stay on it until we saw him back in follow-up.  He ended up emailing me and stated that we never called him back, which we did.  I emailed him back the same response that we had left on the voicemail, and the patient told me he would like to get off of the medication, and I told him that was certainly up to him, and he did that.  Patient ended up going to [facility] for another opinion on January 27, 2020.  I reviewed those records as well.  They concurred with the diagnosis of Parkinson's disease.  Interestingly, those notes state that when the patient stopped the levodopa, his tremor got much worse.  They told him to increase his levodopa to carbidopa/levodopa 25/100, 2 tablets 3 times per day.  Current prescribed movement disorder medications: ***Carbidopa/levodopa 25/100, 2 tablets 3 times per day (increased by [facility] on January 27, 2020)   PREVIOUS MEDICATIONS: {Parkinson's RX:18200}  ALLERGIES:   Allergies  Allergen Reactions  . Adhesive  [Tape] Other (See Comments)    OK with anything BUT paper tape: skin tears - Coban ok to use   . Iodine   . Iohexol     CURRENT MEDICATIONS:  Outpatient Encounter Medications as of 01/30/2020  Medication Sig  . albuterol (PROVENTIL HFA) 108 (90 BASE) MCG/ACT inhaler Inhale 2 puffs into the lungs every 4 (four) hours as needed.    Marland Kitchen amLODipine (NORVASC) 10 MG tablet Take by mouth  Daily.  . carbidopa-levodopa (SINEMET IR) 25-100 MG tablet Take 1 tablet by mouth 3 (three) times daily.  . fluticasone (FLONASE) 50 MCG/ACT nasal spray Place 1 spray into both nostrils daily as needed for allergies or rhinitis.  . fluticasone furoate-vilanterol (BREO ELLIPTA) 100-25 MCG/INH AEPB Inhale 1 puff into the lungs daily.  Marland Kitchen levothyroxine (SYNTHROID) 50 MCG tablet Take 50 mcg by mouth daily before breakfast.   No facility-administered encounter medications on file as of 01/30/2020.    Objective:   PHYSICAL EXAMINATION:    VITALS:  There were no vitals filed for this visit.  GEN:  The patient appears stated age and is in NAD. HEENT:  Normocephalic, atraumatic.  The mucous membranes are moist. The superficial temporal arteries are without ropiness or tenderness. CV:  RRR Lungs:  CTAB Neck/HEME:  There are no carotid bruits bilaterally.  Neurological examination:  Orientation: The patient is alert and oriented x3. Cranial nerves: There is good facial symmetry with*** facial hypomimia. The speech is fluent and clear. Soft palate rises symmetrically and there is no tongue deviation. Hearing is intact to conversational tone. Sensation: Sensation is intact to light touch throughout Motor: Strength is at least antigravity x4.  Movement examination: Tone: There is ***tone in the *** Abnormal movements: *** Coordination:  There is *** decremation with RAM's, *** Gait and Station: The  patient has *** difficulty arising out of a deep-seated chair without the use of the hands. The patient's stride length is ***.  The patient has a *** pull test.     I have reviewed and interpreted the following labs independently    Chemistry      Component Value Date/Time   NA 139 01/26/2015 1408   K 4.1 01/26/2015 1408   CL 106 01/26/2015 1408   CO2 26 01/26/2015 1408   BUN 23 01/26/2015 1408   CREATININE 1.80 (H) 01/26/2015 1408      Component Value Date/Time   CALCIUM 9.4 01/26/2015 1408        Lab Results  Component Value Date   WBC 10.5 01/26/2015   HGB 15.0 01/26/2015   HCT 43.8 01/26/2015   MCV 96.5 01/26/2015   PLT 204.0 01/26/2015    Lab Results  Component Value Date   TSH 4.41 01/26/2015     Total time spent on today's visit was ***30 minutes, including both face-to-face time and nonface-to-face time.  Time included that spent on review of records (prior notes available to me/labs/imaging if pertinent), discussing treatment and goals, answering patient's questions and coordinating care.  Cc:  Seward Carol, MD

## 2020-01-29 ENCOUNTER — Encounter: Payer: Self-pay | Admitting: Neurology

## 2020-01-30 ENCOUNTER — Ambulatory Visit: Payer: Medicare Other | Admitting: Neurology

## 2020-02-04 ENCOUNTER — Telehealth: Payer: Self-pay | Admitting: Neurology

## 2020-02-04 NOTE — Telephone Encounter (Signed)
Patient dismissed from [facility] by Wells Guiles Tat, DO, effective 01/29/20. Dismissal Letter sent out by 1st class mail. [employer]

## 2020-02-08 DIAGNOSIS — R82998 Other abnormal findings in urine: Secondary | ICD-10-CM | POA: Diagnosis not present

## 2020-02-08 DIAGNOSIS — N3000 Acute cystitis without hematuria: Secondary | ICD-10-CM | POA: Diagnosis not present

## 2020-02-08 DIAGNOSIS — R300 Dysuria: Secondary | ICD-10-CM | POA: Diagnosis not present

## 2020-02-16 DIAGNOSIS — C44329 Squamous cell carcinoma of skin of other parts of face: Secondary | ICD-10-CM | POA: Diagnosis not present

## 2020-02-16 DIAGNOSIS — C44320 Squamous cell carcinoma of skin of unspecified parts of face: Secondary | ICD-10-CM | POA: Diagnosis not present

## 2020-02-25 DIAGNOSIS — N3000 Acute cystitis without hematuria: Secondary | ICD-10-CM | POA: Diagnosis not present

## 2020-02-27 DIAGNOSIS — L570 Actinic keratosis: Secondary | ICD-10-CM | POA: Diagnosis not present

## 2020-02-27 DIAGNOSIS — X32XXXA Exposure to sunlight, initial encounter: Secondary | ICD-10-CM | POA: Diagnosis not present

## 2020-02-27 DIAGNOSIS — D045 Carcinoma in situ of skin of trunk: Secondary | ICD-10-CM | POA: Diagnosis not present

## 2020-02-27 DIAGNOSIS — C44319 Basal cell carcinoma of skin of other parts of face: Secondary | ICD-10-CM | POA: Diagnosis not present

## 2020-03-18 ENCOUNTER — Other Ambulatory Visit: Payer: Self-pay | Admitting: Neurology

## 2020-04-06 ENCOUNTER — Ambulatory Visit: Payer: Medicare Other | Admitting: Neurology

## 2020-04-07 DIAGNOSIS — C44212 Basal cell carcinoma of skin of right ear and external auricular canal: Secondary | ICD-10-CM | POA: Diagnosis not present

## 2020-04-07 DIAGNOSIS — C44319 Basal cell carcinoma of skin of other parts of face: Secondary | ICD-10-CM | POA: Diagnosis not present

## 2020-04-07 DIAGNOSIS — Z87891 Personal history of nicotine dependence: Secondary | ICD-10-CM | POA: Diagnosis not present

## 2020-04-07 DIAGNOSIS — Z8639 Personal history of other endocrine, nutritional and metabolic disease: Secondary | ICD-10-CM | POA: Diagnosis not present

## 2020-04-07 DIAGNOSIS — R9389 Abnormal findings on diagnostic imaging of other specified body structures: Secondary | ICD-10-CM | POA: Diagnosis not present

## 2020-04-07 DIAGNOSIS — I739 Peripheral vascular disease, unspecified: Secondary | ICD-10-CM | POA: Diagnosis not present

## 2020-04-07 DIAGNOSIS — I10 Essential (primary) hypertension: Secondary | ICD-10-CM | POA: Diagnosis not present

## 2020-04-07 DIAGNOSIS — Z8701 Personal history of pneumonia (recurrent): Secondary | ICD-10-CM | POA: Diagnosis not present

## 2020-04-07 DIAGNOSIS — G4733 Obstructive sleep apnea (adult) (pediatric): Secondary | ICD-10-CM | POA: Diagnosis not present

## 2020-04-07 DIAGNOSIS — J432 Centrilobular emphysema: Secondary | ICD-10-CM | POA: Diagnosis not present

## 2020-04-07 DIAGNOSIS — C61 Malignant neoplasm of prostate: Secondary | ICD-10-CM | POA: Diagnosis not present

## 2020-04-07 DIAGNOSIS — Z87448 Personal history of other diseases of urinary system: Secondary | ICD-10-CM | POA: Diagnosis not present

## 2020-04-07 DIAGNOSIS — C44311 Basal cell carcinoma of skin of nose: Secondary | ICD-10-CM | POA: Diagnosis not present

## 2020-04-07 DIAGNOSIS — G20 Parkinson's disease: Secondary | ICD-10-CM | POA: Diagnosis not present

## 2020-04-20 DIAGNOSIS — Z85828 Personal history of other malignant neoplasm of skin: Secondary | ICD-10-CM | POA: Diagnosis not present

## 2020-04-20 DIAGNOSIS — L578 Other skin changes due to chronic exposure to nonionizing radiation: Secondary | ICD-10-CM | POA: Diagnosis not present

## 2020-04-20 DIAGNOSIS — X32XXXA Exposure to sunlight, initial encounter: Secondary | ICD-10-CM | POA: Diagnosis not present

## 2020-04-20 DIAGNOSIS — Z08 Encounter for follow-up examination after completed treatment for malignant neoplasm: Secondary | ICD-10-CM | POA: Diagnosis not present

## 2020-04-22 DIAGNOSIS — C61 Malignant neoplasm of prostate: Secondary | ICD-10-CM | POA: Diagnosis not present

## 2020-04-22 DIAGNOSIS — I129 Hypertensive chronic kidney disease with stage 1 through stage 4 chronic kidney disease, or unspecified chronic kidney disease: Secondary | ICD-10-CM | POA: Diagnosis not present

## 2020-04-22 DIAGNOSIS — C792 Secondary malignant neoplasm of skin: Secondary | ICD-10-CM | POA: Diagnosis not present

## 2020-04-22 DIAGNOSIS — N189 Chronic kidney disease, unspecified: Secondary | ICD-10-CM | POA: Diagnosis not present

## 2020-04-22 DIAGNOSIS — Z192 Hormone resistant malignancy status: Secondary | ICD-10-CM | POA: Diagnosis not present

## 2020-04-22 DIAGNOSIS — C779 Secondary and unspecified malignant neoplasm of lymph node, unspecified: Secondary | ICD-10-CM | POA: Diagnosis not present

## 2020-04-22 DIAGNOSIS — Z79899 Other long term (current) drug therapy: Secondary | ICD-10-CM | POA: Diagnosis not present

## 2020-04-22 DIAGNOSIS — G20 Parkinson's disease: Secondary | ICD-10-CM | POA: Diagnosis not present

## 2020-04-27 DIAGNOSIS — N184 Chronic kidney disease, stage 4 (severe): Secondary | ICD-10-CM | POA: Diagnosis not present

## 2020-04-27 DIAGNOSIS — I10 Essential (primary) hypertension: Secondary | ICD-10-CM | POA: Diagnosis not present

## 2020-04-29 DIAGNOSIS — G20 Parkinson's disease: Secondary | ICD-10-CM | POA: Diagnosis not present

## 2020-04-29 DIAGNOSIS — Z23 Encounter for immunization: Secondary | ICD-10-CM | POA: Diagnosis not present

## 2020-04-29 DIAGNOSIS — R42 Dizziness and giddiness: Secondary | ICD-10-CM | POA: Diagnosis not present

## 2020-05-14 DIAGNOSIS — Z961 Presence of intraocular lens: Secondary | ICD-10-CM | POA: Diagnosis not present

## 2020-05-14 DIAGNOSIS — H04223 Epiphora due to insufficient drainage, bilateral lacrimal glands: Secondary | ICD-10-CM | POA: Diagnosis not present

## 2020-05-19 DIAGNOSIS — H8111 Benign paroxysmal vertigo, right ear: Secondary | ICD-10-CM | POA: Diagnosis not present

## 2020-06-10 DIAGNOSIS — H8111 Benign paroxysmal vertigo, right ear: Secondary | ICD-10-CM | POA: Diagnosis not present

## 2020-06-17 DIAGNOSIS — H8111 Benign paroxysmal vertigo, right ear: Secondary | ICD-10-CM | POA: Diagnosis not present

## 2020-06-21 DIAGNOSIS — H8111 Benign paroxysmal vertigo, right ear: Secondary | ICD-10-CM | POA: Diagnosis not present

## 2020-07-07 DIAGNOSIS — Z23 Encounter for immunization: Secondary | ICD-10-CM | POA: Diagnosis not present

## 2020-07-26 DIAGNOSIS — G4752 REM sleep behavior disorder: Secondary | ICD-10-CM | POA: Diagnosis not present

## 2020-07-26 DIAGNOSIS — J449 Chronic obstructive pulmonary disease, unspecified: Secondary | ICD-10-CM | POA: Diagnosis not present

## 2020-07-26 DIAGNOSIS — G478 Other sleep disorders: Secondary | ICD-10-CM | POA: Diagnosis not present

## 2020-07-26 DIAGNOSIS — G4733 Obstructive sleep apnea (adult) (pediatric): Secondary | ICD-10-CM | POA: Diagnosis not present

## 2020-07-26 DIAGNOSIS — Z8546 Personal history of malignant neoplasm of prostate: Secondary | ICD-10-CM | POA: Diagnosis not present

## 2020-07-26 DIAGNOSIS — I739 Peripheral vascular disease, unspecified: Secondary | ICD-10-CM | POA: Diagnosis not present

## 2020-07-26 DIAGNOSIS — E785 Hyperlipidemia, unspecified: Secondary | ICD-10-CM | POA: Diagnosis not present

## 2020-07-26 DIAGNOSIS — G20 Parkinson's disease: Secondary | ICD-10-CM | POA: Diagnosis not present

## 2020-07-26 DIAGNOSIS — I129 Hypertensive chronic kidney disease with stage 1 through stage 4 chronic kidney disease, or unspecified chronic kidney disease: Secondary | ICD-10-CM | POA: Diagnosis not present

## 2020-07-26 DIAGNOSIS — N183 Chronic kidney disease, stage 3 unspecified: Secondary | ICD-10-CM | POA: Diagnosis not present

## 2020-07-26 DIAGNOSIS — Z87891 Personal history of nicotine dependence: Secondary | ICD-10-CM | POA: Diagnosis not present

## 2020-07-26 DIAGNOSIS — J439 Emphysema, unspecified: Secondary | ICD-10-CM | POA: Diagnosis not present

## 2020-07-27 DIAGNOSIS — I10 Essential (primary) hypertension: Secondary | ICD-10-CM | POA: Diagnosis not present

## 2020-07-27 DIAGNOSIS — C61 Malignant neoplasm of prostate: Secondary | ICD-10-CM | POA: Diagnosis not present

## 2020-07-27 DIAGNOSIS — G20 Parkinson's disease: Secondary | ICD-10-CM | POA: Diagnosis not present

## 2020-07-27 DIAGNOSIS — E782 Mixed hyperlipidemia: Secondary | ICD-10-CM | POA: Diagnosis not present

## 2020-07-27 DIAGNOSIS — E7800 Pure hypercholesterolemia, unspecified: Secondary | ICD-10-CM | POA: Diagnosis not present

## 2020-07-27 DIAGNOSIS — E039 Hypothyroidism, unspecified: Secondary | ICD-10-CM | POA: Diagnosis not present

## 2020-07-27 DIAGNOSIS — J449 Chronic obstructive pulmonary disease, unspecified: Secondary | ICD-10-CM | POA: Diagnosis not present

## 2020-07-27 DIAGNOSIS — N184 Chronic kidney disease, stage 4 (severe): Secondary | ICD-10-CM | POA: Diagnosis not present

## 2021-10-11 ENCOUNTER — Other Ambulatory Visit: Payer: Self-pay | Admitting: Internal Medicine

## 2021-10-11 ENCOUNTER — Ambulatory Visit
Admission: RE | Admit: 2021-10-11 | Discharge: 2021-10-11 | Disposition: A | Discharge status: Home / Self Care | Payer: Medicare Other | Source: Ambulatory Visit | Attending: Internal Medicine | Admitting: Internal Medicine

## 2021-10-11 DIAGNOSIS — R0609 Other forms of dyspnea: Secondary | ICD-10-CM

## 2021-10-14 ENCOUNTER — Other Ambulatory Visit: Payer: Self-pay

## 2021-10-14 ENCOUNTER — Encounter: Payer: Self-pay | Admitting: Cardiovascular Disease

## 2021-10-14 ENCOUNTER — Ambulatory Visit (INDEPENDENT_AMBULATORY_CARE_PROVIDER_SITE_OTHER): Payer: Medicare Other | Admitting: Cardiovascular Disease

## 2021-10-14 ENCOUNTER — Encounter: Payer: Self-pay | Admitting: *Deleted

## 2021-10-14 VITALS — BP 116/62 | HR 56 | Ht 70.5 in | Wt 192.0 lb

## 2021-10-14 DIAGNOSIS — R0609 Other forms of dyspnea: Secondary | ICD-10-CM

## 2021-10-14 DIAGNOSIS — I10 Essential (primary) hypertension: Secondary | ICD-10-CM

## 2021-10-14 NOTE — Patient Instructions (Signed)
Medication Instructions:  ?No changes ?*If you need a refill on your cardiac medications before your next appointment, please call your pharmacy* ? ? ?Lab Work: ?none ?If you have labs (blood work) drawn today and your tests are completely normal, you will receive your results only by: ?MyChart Message (if you have MyChart) OR ?A paper copy in the mail ?If you have any lab test that is abnormal or we need to change your treatment, we will call you to review the results. ? ? ?Testing/Procedures: ?Your physician has requested that you have en exercise stress myoview. For further information please visit [email or web address]. Please follow instruction sheet, as given. ? ?Your physician has requested that you have an echocardiogram. Echocardiography is a painless test that uses sound waves to create images of your heart. It provides your doctor with information about the size and shape of your heart and how well your heart?s chambers and valves are working. This procedure takes approximately one hour. There are no restrictions for this procedure. ? ? ?Follow-Up: ?At [facility], you and your health needs are our priority.  As part of our continuing mission to provide you with exceptional heart care, we have created designated Provider Care Teams.  These Care Teams include your primary Cardiologist (physician) and Advanced Practice Providers (APPs -  Physician Assistants and Nurse Practitioners) who all work together to provide you with the care you need, when you need it. ? ?We recommend signing up for the patient portal called "MyChart".  Sign up information is provided on this After Visit Summary.  MyChart is used to connect with patients for Virtual Visits (Telemedicine).  Patients are able to view lab/test results, encounter notes, upcoming appointments, etc.  Non-urgent messages can be sent to your provider as well.   ?To learn more about what you can do with MyChart, go to [email or web address].   ? ?Your next  appointment:   ?4-8 week(s) ? ?The format for your next appointment:   ?In Person ? ?Provider:   ?Lenna Sciara, MD ? ? ?Other Instructions ?  ?

## 2021-10-14 NOTE — Progress Notes (Signed)
? ?Chief Complaint  ?Patient presents with  ? New Patient (Initial Visit)  ?  Dyspnea on exertion  ? ?History of Present Illness: 86 yo male with history of AAA, CKD, COPD, hyperlipidemia, HTN, hypothyroidism, sleep apnea, Parkinson's disease, prostate cancer and former tobacco abuse (stopped smoking in 1976), here today as a new patient for the evaluation of dyspnea on exertion. He has no chest pain. He has noticed dyspnea with moderate exertion He has known COPD. EKG in primary care with sinus, LVH and PVCs. He was diagnosed with Parkinson's disease 18 months ago. He is generally very active. Some dizziness at home. His wife is retired Marine scientist and reports that he has soft BP at times. Chest x-ray earlier this week with no pleural effusion or evidence of pulmonary edema.  ? ?Primary Care Physician: Seward Carol, MD' ? ? ?Past Medical History:  ?Diagnosis Date  ? Abdominal aortic aneurysm   ? Allergic   ? Aneurysm of infrarenal abdominal aorta   ? Aortic atherosclerosis ([place])   ? Back pain   ? BPH (benign prostatic hyperplasia)   ? CKD (chronic kidney disease)   ? COPD (chronic obstructive pulmonary disease) ([place])   ? Cough due to ACE inhibitor   ? Dyslipidemia   ? ED (erectile dysfunction)   ? Hypercholesterolemia   ? Hyperlipidemia   ? Hypertension   ? Hypothyroid   ? Iatrogenic pulmonary embolism and infarction [facility])   ? OSA (obstructive sleep apnea)   ? on cpap faithfully  ? Pain in joint   ? Parkinson disease ([place])   ? Prostate cancer ([place])   ? Pulmonary nodule   ? Renal failure   ? Respiratory infection   ? Sleep apnea   ? Syncope   ? ? ?Past Surgical History:  ?Procedure Laterality Date  ? CATARACT EXTRACTION, BILATERAL    ? collapsed lung  2006  ? PROSTATE CRYOABLATION    ? PROSTATE SURGERY    ? repair right index finger torn collatral ligament    ? vats for spontaneous ptx    ? ? ?Current Outpatient Medications  ?Medication Sig Dispense Refill  ? albuterol (VENTOLIN HFA) 108 (90 Base) MCG/ACT inhaler  Inhale 2 puffs into the lungs every 4 (four) hours as needed.      ? amantadine (SYMMETREL) 100 MG capsule Take 1 capsule by mouth daily.    ? amLODipine (NORVASC) 10 MG tablet Take by mouth Daily.    ? carbidopa-levodopa (SINEMET IR) 25-100 MG tablet Take 2 tablets by mouth 3 (three) times daily.    ? cyanocobalamin (,VITAMIN B-12,) 1000 MCG/ML injection Inject 1,000 mcg into the muscle every 30 (thirty) days.    ? escitalopram (LEXAPRO) 10 MG tablet Take 10 mg by mouth daily.    ? fluticasone (FLONASE) 50 MCG/ACT nasal spray Place 1 spray into both nostrils daily as needed for allergies or rhinitis.    ? fluticasone furoate-vilanterol (BREO ELLIPTA) 100-25 MCG/INH AEPB Inhale 1 puff into the lungs daily.    ? levothyroxine (SYNTHROID) 50 MCG tablet Take 50 mcg by mouth daily before breakfast.    ? carbidopa-levodopa (SINEMET IR) 25-100 MG tablet Take 1 tablet by mouth 3 (three) times daily. (Patient not taking: Reported on 10/14/2021) 270 tablet 1  ? ?No current facility-administered medications for this visit.  ? ? ?Allergies  ?Allergen Reactions  ? Adhesive  [Tape] Other (See Comments)  ?  OK with anything BUT paper tape: skin tears - Coban ok to use   ?  Iodine   ? Iohexol   ? ? ?Social History  ? ?Socioeconomic History  ? Marital status: Married  ?  Spouse name: Not on file  ? Number of children: 2  ? Years of education: Not on file  ? Highest education level: Not on file  ?Occupational History  ? Occupation: retired  ?  Comment: Recruitment consultant  ? Occupation: Herbalist  ?Tobacco Use  ? Smoking status: Former  ?  Packs/day: 2.00  ?  Types: Cigarettes  ?  Quit date: 01/29/1975  ?  Years since quitting: 46.7  ? Smokeless tobacco: Never  ?Vaping Use  ? Vaping Use: Never used  ?Substance and Sexual Activity  ? Alcohol use: Not Currently  ? Drug use: No  ? Sexual activity: Not on file  ?Other Topics Concern  ? Not on file  ?Social History Narrative  ? Right handed  ?   ? College  ?   ? Lives with  wife  ? ?Social Determinants of Health  ? ?Financial Resource Strain: Not on file  ?Food Insecurity: Not on file  ?Transportation Needs: Not on file  ?Physical Activity: Not on file  ?Stress: Not on file  ?Social Connections: Not on file  ?Intimate Partner Violence: Not on file  ? ? ?Family History  ?Problem Relation Age of Onset  ? Heart disease Father   ?     Heart Disease before age 46  ? Hyperlipidemia Father   ? Hypertension Father   ? Heart attack Father   ? Ovarian cancer Mother   ? Lung cancer Mother   ? Cancer Mother   ?     Lung Cancer  ? Diabetes Mother   ? Hyperlipidemia Mother   ? Hypertension Mother   ? Diabetes Brother   ? Heart disease Brother   ?     Heart Disease before age 53  ? Hyperlipidemia Brother   ? Hypertension Brother   ? Heart attack Brother   ? Colon cancer Maternal Grandfather   ? ? ?Review of Systems:  As stated in the HPI and otherwise negative.  ? ?BP 116/62   Pulse (!) 56   Ht 5' 10.5" (1.791 m)   Wt 192 lb (87.1 kg)   SpO2 99%   BMI 27.16 kg/m?  ? ?Physical Examination: ?General: Well developed, well nourished, NAD  ?HEENT: OP clear, mucus membranes moist  ?SKIN: warm, dry. No rashes. ?Neuro: No focal deficits  ?Musculoskeletal: Muscle strength 5/5 all ext  ?Psychiatric: Mood and affect normal  ?Neck: No JVD, no carotid bruits, no thyromegaly, no lymphadenopathy.  ?Lungs:Crackles right base. No wheezes, rhonci ?Cardiovascular: Regular rate and rhythm. No murmurs, gallops or rubs. ?Abdomen:Soft. Bowel sounds present. Non-tender.  ?Extremities: No lower extremity edema. Pulses are 2 + in the bilateral DP/PT. ? ?EKG:  EKG is not ordered today. ?The ekg ordered today demonstrates  ? ?Recent Labs: ?No results found for requested labs within last 8760 hours.  ? ?Lipid Panel ?No results found for: CHOL, TRIG, HDL, CHOLHDL, VLDL, LDLCALC, LDLDIRECT ?  ?Wt Readings from Last 3 Encounters:  ?10/14/21 192 lb (87.1 kg)  ?09/25/19 190 lb (86.2 kg)  ?04/03/19 201 lb (91.2 kg)  ?   ?Assessment and Plan:  ? ?1. Dyspnea on exertion: Risk factors for CAD include HTN, HLD, advanced age and former tobacco abuse. Will arrange an exercise nuclear stress test to exclude ischemia. Echo to assess LVEF and exclude structural heart disease.  ? ?2. HTN: BP has  been soft at home. I have advised him to cut back on his Norvasc to 5 mg daily.  ? ?Labs/ tests ordered today include:  ? ?Orders Placed This Encounter  ?Procedures  ? MYOCARDIAL PERFUSION IMAGING  ? ECHOCARDIOGRAM COMPLETE  ? ?Disposition:   F/U with me in 4-8 weeks.  ? ?Signed, ?Lauree Chandler, MD ?10/14/2021 2:37 PM    ?[place] ?[address], [place], [place]  [address] ?Phone: [phone]; Fax: [phone]  ? ? ?

## 2021-10-19 ENCOUNTER — Telehealth (HOSPITAL_COMMUNITY): Payer: Self-pay | Admitting: *Deleted

## 2021-10-19 NOTE — Telephone Encounter (Signed)
Patient given detailed instructions per Myocardial Perfusion Study Information Sheet for the test on 10/26/21 Patient notified to arrive 15 minutes early and that it is imperative to arrive on time for appointment to keep from having the test rescheduled. ? If you need to cancel or reschedule your appointment, please call the office within 24 hours of your appointment. . Patient verbalized understanding. Christopher Wilkerson ? ? ?

## 2021-10-26 ENCOUNTER — Other Ambulatory Visit: Payer: Self-pay

## 2021-10-26 ENCOUNTER — Ambulatory Visit (HOSPITAL_BASED_OUTPATIENT_CLINIC_OR_DEPARTMENT_OTHER): Payer: Medicare Other

## 2021-10-26 ENCOUNTER — Ambulatory Visit (HOSPITAL_COMMUNITY): Payer: Medicare Other | Attending: Cardiology

## 2021-10-26 DIAGNOSIS — I10 Essential (primary) hypertension: Secondary | ICD-10-CM | POA: Insufficient documentation

## 2021-10-26 DIAGNOSIS — R0609 Other forms of dyspnea: Secondary | ICD-10-CM

## 2021-10-26 LAB — MYOCARDIAL PERFUSION IMAGING
LV dias vol: 93 mL (ref 62–150)
LV sys vol: 41 mL
Nuc Stress EF: 56 %
Peak HR: 78 {beats}/min
Rest HR: 49 {beats}/min
Rest Nuclear Isotope Dose: 10.2 mCi
SDS: 0
SRS: 2
SSS: 2
ST Depression (mm): 0 mm
Stress Nuclear Isotope Dose: 30.3 mCi
TID: 0.9

## 2021-10-26 LAB — ECHOCARDIOGRAM COMPLETE
Area-P 1/2: 1.36 cm2
Height: 70.5 in
P 1/2 time: 870 msec
S' Lateral: 4.1 cm
Weight: 3072 oz

## 2021-10-26 MED ORDER — TECHNETIUM TC 99M TETROFOSMIN IV KIT
30.3000 | PACK | Freq: Once | INTRAVENOUS | Status: AC | PRN
  Administered 2021-10-26: 30.3 via INTRAVENOUS
  Filled 2021-10-26: qty 31

## 2021-10-26 MED ORDER — TECHNETIUM TC 99M TETROFOSMIN IV KIT
10.2000 | PACK | Freq: Once | INTRAVENOUS | Status: AC | PRN
  Administered 2021-10-26: 10.2 via INTRAVENOUS
  Filled 2021-10-26: qty 11

## 2021-10-26 MED ORDER — REGADENOSON 0.4 MG/5ML IV SOLN
0.4000 mg | Freq: Once | INTRAVENOUS | Status: AC
  Administered 2021-10-26: 0.4 mg via INTRAVENOUS

## 2021-10-28 ENCOUNTER — Other Ambulatory Visit: Payer: Self-pay | Admitting: Internal Medicine

## 2021-10-28 DIAGNOSIS — R9389 Abnormal findings on diagnostic imaging of other specified body structures: Secondary | ICD-10-CM

## 2021-10-31 ENCOUNTER — Encounter: Payer: Self-pay | Admitting: Pulmonary Disease

## 2021-10-31 ENCOUNTER — Ambulatory Visit (INDEPENDENT_AMBULATORY_CARE_PROVIDER_SITE_OTHER): Payer: Medicare Other | Admitting: Pulmonary Disease

## 2021-10-31 VITALS — BP 118/72 | HR 52 | Temp 97.6°F | Ht 70.0 in | Wt 187.4 lb

## 2021-10-31 DIAGNOSIS — R0602 Shortness of breath: Secondary | ICD-10-CM

## 2021-10-31 DIAGNOSIS — R0609 Other forms of dyspnea: Secondary | ICD-10-CM

## 2021-10-31 DIAGNOSIS — J9811 Atelectasis: Secondary | ICD-10-CM | POA: Diagnosis not present

## 2021-10-31 DIAGNOSIS — R911 Solitary pulmonary nodule: Secondary | ICD-10-CM | POA: Insufficient documentation

## 2021-10-31 NOTE — Progress Notes (Signed)
? ?Synopsis: Referred in April 2023 for DOE by Seward Carol, MD ? ?Subjective:  ? ?PATIENT ID: Christopher Wilkerson GENDER: male DOB: 1936-02-14, MRN: [id number] ? ?Chief Complaint  ?Patient presents with  ? Consult  ?  Increased SOB   ? ? ?PMH prostate cancer, on injections, HTN, Kidney disease, emphysema. Former smoker, 15 years. Had blebs, second ptx.  From a respiratory standpoint he has been doing okay except for the past couple of weeks.  Patient developed worsening shortness of breath.  He had a recent echo and a stress test and was told this looked okay.  He did see a pulmonologist at [facility] a little over a year ago and was told to stop his inhalers and that his pulmonary function test looked pretty good.  His wife is worried that he has had continued decline over the past couple of weeks.  He had a recent chest x-ray which shows partial right upper lobe collapse. ? ? ?Past Medical History:  ?Diagnosis Date  ? Abdominal aortic aneurysm ([place])   ? Allergic   ? Aneurysm of infrarenal abdominal aorta ([facility])   ? Aortic atherosclerosis ([place])   ? Back pain   ? BPH (benign prostatic hyperplasia)   ? CKD (chronic kidney disease)   ? COPD (chronic obstructive pulmonary disease) ([place])   ? Cough due to ACE inhibitor   ? Dyslipidemia   ? ED (erectile dysfunction)   ? Hypercholesterolemia   ? Hyperlipidemia   ? Hypertension   ? Hypothyroid   ? Iatrogenic pulmonary embolism and infarction [facility])   ? OSA (obstructive sleep apnea)   ? on cpap faithfully  ? Pain in joint   ? Parkinson disease ([place])   ? Prostate cancer ([place])   ? Pulmonary nodule   ? Renal failure   ? Respiratory infection   ? Sleep apnea   ? Syncope   ?  ? ?Family History  ?Problem Relation Age of Onset  ? Heart disease Father   ?     Heart Disease before age 75  ? Hyperlipidemia Father   ? Hypertension Father   ? Heart attack Father   ? Ovarian cancer Mother   ? Lung cancer Mother   ? Cancer Mother   ?     Lung Cancer  ? Diabetes Mother   ? Hyperlipidemia Mother    ? Hypertension Mother   ? Diabetes Brother   ? Heart disease Brother   ?     Heart Disease before age 1  ? Hyperlipidemia Brother   ? Hypertension Brother   ? Heart attack Brother   ? Colon cancer Maternal Grandfather   ?  ? ?Past Surgical History:  ?Procedure Laterality Date  ? CATARACT EXTRACTION, BILATERAL    ? collapsed lung  2006  ? PROSTATE CRYOABLATION    ? PROSTATE SURGERY    ? repair right index finger torn collatral ligament    ? vats for spontaneous ptx    ? ? ?Social History  ? ?Socioeconomic History  ? Marital status: Married  ?  Spouse name: Not on file  ? Number of children: 2  ? Years of education: Not on file  ? Highest education level: Not on file  ?Occupational History  ? Occupation: retired  ?  Comment: Recruitment consultant  ? Occupation: Herbalist  ?Tobacco Use  ? Smoking status: Former  ?  Packs/day: 2.00  ?  Types: Cigarettes  ?  Quit date: 01/29/1975  ?  Years  since quitting: 46.7  ? Smokeless tobacco: Never  ?Vaping Use  ? Vaping Use: Never used  ?Substance and Sexual Activity  ? Alcohol use: Not Currently  ? Drug use: No  ? Sexual activity: Not on file  ?Other Topics Concern  ? Not on file  ?Social History Narrative  ? Right handed  ?   ? College  ?   ? Lives with wife  ? ?Social Determinants of Health  ? ?Financial Resource Strain: Not on file  ?Food Insecurity: Not on file  ?Transportation Needs: Not on file  ?Physical Activity: Not on file  ?Stress: Not on file  ?Social Connections: Not on file  ?Intimate Partner Violence: Not on file  ?  ? ?Allergies  ?Allergen Reactions  ? Adhesive  [Tape] Other (See Comments)  ?  OK with anything BUT paper tape: skin tears - Coban ok to use   ? Iodine   ? Iohexol   ?  ? ?Outpatient Medications Prior to Visit  ?Medication Sig Dispense Refill  ? albuterol (VENTOLIN HFA) 108 (90 Base) MCG/ACT inhaler Inhale 2 puffs into the lungs every 4 (four) hours as needed.      ? amantadine (SYMMETREL) 100 MG capsule Take 1 capsule by mouth daily.     ? amLODipine (NORVASC) 10 MG tablet Take by mouth Daily.    ? carbidopa-levodopa (SINEMET IR) 25-100 MG tablet Take 1 tablet by mouth 3 (three) times daily. 270 tablet 1  ? carbidopa-levodopa (SINEMET IR) 25-100 MG tablet Take 2 tablets by mouth 3 (three) times daily.    ? cyanocobalamin (,VITAMIN B-12,) 1000 MCG/ML injection Inject 1,000 mcg into the muscle every 30 (thirty) days.    ? escitalopram (LEXAPRO) 10 MG tablet Take 10 mg by mouth daily.    ? fluticasone (FLONASE) 50 MCG/ACT nasal spray Place 1 spray into both nostrils daily as needed for allergies or rhinitis.    ? levothyroxine (SYNTHROID) 50 MCG tablet Take 50 mcg by mouth daily before breakfast.    ? fluticasone furoate-vilanterol (BREO ELLIPTA) 100-25 MCG/INH AEPB Inhale 1 puff into the lungs daily. (Patient not taking: Reported on 10/31/2021)    ? ?No facility-administered medications prior to visit.  ? ? ?Review of Systems  ?Constitutional:  Negative for chills, fever, malaise/fatigue and weight loss.  ?HENT:  Negative for hearing loss, sore throat and tinnitus.   ?Eyes:  Negative for blurred vision and double vision.  ?Respiratory:  Positive for shortness of breath. Negative for cough, hemoptysis, sputum production, wheezing and stridor.   ?Cardiovascular:  Negative for chest pain, palpitations, orthopnea, leg swelling and PND.  ?Gastrointestinal:  Negative for abdominal pain, constipation, diarrhea, heartburn, nausea and vomiting.  ?Genitourinary:  Negative for dysuria, hematuria and urgency.  ?Musculoskeletal:  Negative for joint pain and myalgias.  ?Skin:  Negative for itching and rash.  ?Neurological:  Positive for tremors. Negative for dizziness, tingling, weakness and headaches.  ?Endo/Heme/Allergies:  Negative for environmental allergies. Does not bruise/bleed easily.  ?Psychiatric/Behavioral:  Negative for depression. The patient is not nervous/anxious and does not have insomnia.   ?All other systems reviewed and are  negative. ? ? ?Objective:  ?Physical Exam ?Vitals reviewed.  ?Constitutional:   ?   General: He is not in acute distress. ?   Appearance: He is well-developed.  ?HENT:  ?   Head: Normocephalic and atraumatic.  ?Eyes:  ?   General: No scleral icterus. ?   Conjunctiva/sclera: Conjunctivae normal.  ?   Pupils: Pupils are equal, round,  and reactive to light.  ?Neck:  ?   Vascular: No JVD.  ?   Trachea: No tracheal deviation.  ?Cardiovascular:  ?   Rate and Rhythm: Normal rate and regular rhythm.  ?   Heart sounds: Normal heart sounds. No murmur heard. ?Pulmonary:  ?   Effort: Pulmonary effort is normal. No tachypnea, accessory muscle usage or respiratory distress.  ?   Breath sounds: No stridor. No wheezing, rhonchi or rales.  ?   Comments: Diminished breath sounds in the right versus the left.  No rhonchi no transmitted sounds. ?Abdominal:  ?   General: There is no distension.  ?   Palpations: Abdomen is soft.  ?   Tenderness: There is no abdominal tenderness.  ?Musculoskeletal:     ?   General: No tenderness.  ?   Cervical back: Neck supple.  ?   Comments: Mild tremor  ?Lymphadenopathy:  ?   Cervical: No cervical adenopathy.  ?Skin: ?   General: Skin is warm and dry.  ?   Capillary Refill: Capillary refill takes less than 2 seconds.  ?   Findings: No rash.  ?Neurological:  ?   Mental Status: He is alert and oriented to person, place, and time.  ?Psychiatric:     ?   Behavior: Behavior normal.  ? ? ? ?Vitals:  ? 10/31/21 1511  ?BP: 118/72  ?Pulse: (!) 52  ?Temp: 97.6 ?F (36.4 ?C)  ?TempSrc: Oral  ?SpO2: 98%  ?Weight: 187 lb 6.4 oz (85 kg)  ?Height: '5\' 10"'$  (1.778 m)  ? ?98% on RA ?BMI Readings from Last 3 Encounters:  ?10/31/21 26.89 kg/m?  ?10/26/21 27.16 kg/m?  ?10/14/21 27.16 kg/m?  ? ?Wt Readings from Last 3 Encounters:  ?10/31/21 187 lb 6.4 oz (85 kg)  ?10/26/21 192 lb (87.1 kg)  ?10/14/21 192 lb (87.1 kg)  ? ? ? ?CBC ?   ?Component Value Date/Time  ? WBC 10.5 01/26/2015 1408  ? RBC 4.54 01/26/2015 1408  ? HGB 15.0  01/26/2015 1408  ? HCT 43.8 01/26/2015 1408  ? PLT 204.0 01/26/2015 1408  ? MCV 96.5 01/26/2015 1408  ? MCHC 34.3 01/26/2015 1408  ? RDW 12.6 01/26/2015 1408  ? LYMPHSABS 1.6 01/26/2015 1408  ? MONOABS 1.1 (H) 06/28/201

## 2021-10-31 NOTE — Patient Instructions (Signed)
Thank you for visiting Dr. Valeta Harms at [facility]. ?Today we recommend the following: ? ?Orders Placed This Encounter  ?Procedures  ? CT Chest Wo Contrast  ? ?Please schedule CT Chest ASAP, follow up afterwards  ? ?Return in about 1 week (around 11/07/2021) for with Eric Form, NP. ? ? ? ?Please do your part to reduce the spread of COVID-19.  ? ?

## 2021-11-01 ENCOUNTER — Ambulatory Visit
Admission: RE | Admit: 2021-11-01 | Discharge: 2021-11-01 | Disposition: A | Discharge status: Home / Self Care | Payer: Medicare Other | Source: Ambulatory Visit | Attending: Pulmonary Disease | Admitting: Pulmonary Disease

## 2021-11-01 ENCOUNTER — Telehealth: Payer: Self-pay | Admitting: *Deleted

## 2021-11-01 ENCOUNTER — Other Ambulatory Visit: Payer: Self-pay | Admitting: Cardiovascular Disease

## 2021-11-01 ENCOUNTER — Ambulatory Visit
Admission: RE | Admit: 2021-11-01 | Discharge: 2021-11-01 | Disposition: A | Discharge status: Home / Self Care | Payer: Medicare Other | Source: Ambulatory Visit | Attending: Internal Medicine | Admitting: Internal Medicine

## 2021-11-01 ENCOUNTER — Other Ambulatory Visit: Payer: Self-pay | Admitting: Internal Medicine

## 2021-11-01 DIAGNOSIS — R931 Abnormal findings on diagnostic imaging of heart and coronary circulation: Secondary | ICD-10-CM

## 2021-11-01 DIAGNOSIS — R0602 Shortness of breath: Secondary | ICD-10-CM

## 2021-11-01 DIAGNOSIS — M545 Low back pain, unspecified: Secondary | ICD-10-CM

## 2021-11-01 DIAGNOSIS — J9811 Atelectasis: Secondary | ICD-10-CM

## 2021-11-01 MED ORDER — PREDNISONE 50 MG PO TABS: ORAL_TABLET | ORAL | 0 refills | Status: DC

## 2021-11-01 NOTE — Telephone Encounter (Signed)
-----   Message from [name], MD sent at 10/26/2021  4:49 PM EDT ----- ?His heart is strong and has poor relaxation which is common at his age. Mild leakiness of the mitral valve and the aortic valve. His ascending aorta appears slightly dilated. If he is willing, I would like to arrange a chest CTA to evaluate his aorta. Also see normal stress test. Christopher Wilkerson ?

## 2021-11-01 NOTE — Telephone Encounter (Signed)
Patient voices understanding and agreement with plan.  He is getting chest ct (non contrast) for pulmonary.  He understands this will be difference dedicated CT to look aorta.  Contrast allergy.  No recent BMET.  Order placed for CT.  Aware we will call him to arrange.  ? ?Discussed pre med for contrast allergy as 13 hrs prior, 7 hrs prior and one hr prior -last dose to include benadryl 50 mg.  He will have a driver.  Will need labs prior. ?

## 2021-11-07 ENCOUNTER — Ambulatory Visit (INDEPENDENT_AMBULATORY_CARE_PROVIDER_SITE_OTHER): Payer: Medicare Other | Admitting: Primary Care

## 2021-11-07 ENCOUNTER — Encounter: Payer: Self-pay | Admitting: Primary Care

## 2021-11-07 DIAGNOSIS — J438 Other emphysema: Secondary | ICD-10-CM | POA: Diagnosis not present

## 2021-11-07 DIAGNOSIS — R911 Solitary pulmonary nodule: Secondary | ICD-10-CM | POA: Diagnosis not present

## 2021-11-07 DIAGNOSIS — J9811 Atelectasis: Secondary | ICD-10-CM

## 2021-11-07 NOTE — Patient Instructions (Addendum)
CT chest showed decrease in size right middle lobe nodules compared to 05/2015, indicating benign process and does not need further work up. Mild changed centrilobular and paraseptal emphysema. Stable chronic scarring right upper lobe. Recommend getting update pulmonary function testing, depending on these result may want to try you back on a different inhaler for COPD ? ?Orders: ?PFTs  ? ?Follow-up: ?First available with Dre. Icad with 1 hour PFTs prior  ?

## 2021-11-07 NOTE — Assessment & Plan Note (Signed)
-   Mild obstruction on PFTs in 2011. Patient reports increased dyspnea symptoms. He has mild progression of his emphysema on chest imaging. Recommend getting update pulmonary function testing. Consider trial LAMA or LABA/LAMA depending on results.  ?

## 2021-11-07 NOTE — Assessment & Plan Note (Addendum)
-   RML nodule compared to 2016 has decreased in size, currently measuring 52m, indicating benign process and does not need further work up  ?

## 2021-11-07 NOTE — Progress Notes (Signed)
? ?'@Patient'$  ID: Christopher Wilkerson, male    DOB: 10-16-1935, 86 y.o.   MRN: [id number] ? ?Chief Complaint  ?Patient presents with  ? Follow-up  ?  Follow up after CT scan.   ? ? ?Referring provider: ?Seward Carol, MD ? ?HPI: ?86 year old male, former smoker quit 1976.  Past medical history significant COPD, solitary pulmonary nodule, obstructive sleep apnea, pulmonary infiltrates, hypertension, Parkinson's disease, stage III chronic renal insufficiency, prostate cancer.  Patient of Dr. Valeta Harms, seen on 10/31/21 for sob.  ? ?Previous [facility] pulmonary encounter: ?10/31/21- Dr. Valeta Harms ?PMH prostate cancer, on injections, HTN, Kidney disease, emphysema. Former smoker, 15 years. Had blebs, second ptx.  From a respiratory standpoint he has been doing okay except for the past couple of weeks.  Patient developed worsening shortness of breath.  He had a recent echo and a stress test and was told this looked okay.  He did see a pulmonologist at [facility] a little over a year ago and was told to stop his inhalers and that his pulmonary function test looked pretty good.  His wife is worried that he has had continued decline over the past couple of weeks.  He had a recent chest x-ray which shows partial right upper lobe collapse. ?  ? ?11/07/2021- Interim hx  ?Patient presents today to review recent CT results. He was seen for consult on 10/31/21 for shortness of breath. He has had cardiac work up that was unrevealing and has been told in the past that he did not need to be on inhalers and that his pulmonary function testing looked alright. He had a chest xray that did show partial right upper lobe collapse.  ? ?Accompanied by his wife today. He has no acute complaints. Continues to have baseline dyspnea symptoms. He gets out of breath with any exertion, including walking up 1 flight of stairs and 1 block. He has been off of BREO for several years. CT chest on 11/01/21 showed decrease in size right middle lobe nodule compared to 05/2015,  indicating benign process and does not need further work up. Mild progression bullous centrilobular and paraseptal emphysema. Stable chronic scarring right upper lobe. Denies chest tightness, cough or wheezing.  ? ? ?Allergies  ?Allergen Reactions  ? Adhesive  [Tape] Other (See Comments)  ?  OK with anything BUT paper tape: skin tears - Coban ok to use   ? Iodine   ? Iohexol   ? ? ?Immunization History  ?Administered Date(s) Administered  ? Influenza Split 06/07/2009, 04/13/2010, 05/12/2010, 05/10/2011, 04/24/2012, 06/18/2013, 05/13/2014, 08/04/2019, 05/24/2021  ? Influenza Whole 06/07/2009, 04/13/2010  ? Influenza,inj,Quad PF,6+ Mos 06/18/2013  ? Influenza,inj,quad, With Preservative 05/18/2014  ? Influenza-Unspecified 05/12/2010, 05/10/2011, 04/24/2012, 06/18/2013, 05/13/2014, 08/04/2019, 05/24/2021  ? Moderna Sars-Covid-2 Vaccination 04/29/2020  ? PFIZER Comirnaty(Gray Top)Covid-19 Tri-Sucrose Vaccine 04/29/2020, 12/07/2020, 05/12/2021  ? PFIZER(Purple Top)SARS-COV-2 Vaccination 08/11/2019, 09/03/2019, 04/29/2020, 12/07/2020, 05/12/2021  ? Pneumococcal Conjugate-13 05/13/2014, 05/02/2017  ? Pneumococcal Polysaccharide-23 05/12/2005, 04/30/2006  ? Td 07/31/2006  ? Tdap 12/13/2016  ? Zoster, Live 10/13/2011  ? ? ?Past Medical History:  ?Diagnosis Date  ? Abdominal aortic aneurysm ([place])   ? Allergic   ? Aneurysm of infrarenal abdominal aorta (HCC)   ? Aortic atherosclerosis ([place])   ? Back pain   ? BPH (benign prostatic hyperplasia)   ? CKD (chronic kidney disease)   ? COPD (chronic obstructive pulmonary disease) ([place])   ? Cough due to ACE inhibitor   ? Dyslipidemia   ? ED (erectile dysfunction)   ?  Hypercholesterolemia   ? Hyperlipidemia   ? Hypertension   ? Hypothyroid   ? Iatrogenic pulmonary embolism and infarction [facility])   ? OSA (obstructive sleep apnea)   ? on cpap faithfully  ? Pain in joint   ? Parkinson disease ([place])   ? Prostate cancer ([place])   ? Pulmonary nodule   ? Renal failure   ? Respiratory infection    ? Sleep apnea   ? Syncope   ? ? ?Tobacco History: ?Social History  ? ?Tobacco Use  ?Smoking Status Former  ? Packs/day: 2.00  ? Types: Cigarettes  ? Quit date: 01/29/1975  ? Years since quitting: 46.8  ?Smokeless Tobacco Never  ? ?Counseling given: Not Answered ? ? ?Outpatient Medications Prior to Visit  ?Medication Sig Dispense Refill  ? albuterol (VENTOLIN HFA) 108 (90 Base) MCG/ACT inhaler Inhale 2 puffs into the lungs every 4 (four) hours as needed.      ? carbidopa-levodopa (SINEMET IR) 25-100 MG tablet Take 1 tablet by mouth 3 (three) times daily. 270 tablet 1  ? cyanocobalamin (,VITAMIN B-12,) 1000 MCG/ML injection Inject 1,000 mcg into the muscle every 30 (thirty) days.    ? escitalopram (LEXAPRO) 10 MG tablet Take 10 mg by mouth daily.    ? fludrocortisone (FLORINEF) 0.1 MG tablet Take 100 mcg by mouth daily.    ? fluticasone (FLONASE) 50 MCG/ACT nasal spray Place 1 spray into both nostrils daily as needed for allergies or rhinitis.    ? levothyroxine (SYNTHROID) 50 MCG tablet Take 50 mcg by mouth daily before breakfast.    ? predniSONE (DELTASONE) 50 MG tablet Take as directed for contrast allergy 3 tablet 0  ? fluticasone furoate-vilanterol (BREO ELLIPTA) 100-25 MCG/INH AEPB Inhale 1 puff into the lungs daily.    ? amantadine (SYMMETREL) 100 MG capsule Take 1 capsule by mouth daily.    ? amLODipine (NORVASC) 10 MG tablet Take by mouth Daily.    ? carbidopa-levodopa (SINEMET IR) 25-100 MG tablet Take 2 tablets by mouth 3 (three) times daily.    ? ?No facility-administered medications prior to visit.  ? ? ?Review of Systems ? ?Review of Systems  ?Constitutional: Negative.   ?HENT: Negative.    ?Respiratory:  Positive for shortness of breath. Negative for cough, chest tightness and wheezing.   ?Cardiovascular: Negative.   ? ? ?Physical Exam ? ?BP 130/82 (BP Location: Left Arm, Patient Position: Sitting, Cuff Size: Normal)   Pulse (!) 51   Temp 98.7 ?F (37.1 ?C) (Oral)   Ht '5\' 10"'$  (1.778 m)   Wt 188 lb 9.6  oz (85.5 kg)   SpO2 96%   BMI 27.06 kg/m?  ?Physical Exam ?Constitutional:   ?   Appearance: Normal appearance.  ?HENT:  ?   Head: Normocephalic and atraumatic.  ?   Right Ear: There is impacted cerumen.  ?   Left Ear: There is impacted cerumen.  ?   Mouth/Throat:  ?   Mouth: Mucous membranes are moist.  ?   Pharynx: Oropharynx is clear.  ?Cardiovascular:  ?   Rate and Rhythm: Normal rate and regular rhythm.  ?   Comments: No edema ?Pulmonary:  ?   Effort: Pulmonary effort is normal.  ?   Breath sounds: Normal breath sounds.  ?Musculoskeletal:     ?   General: Normal range of motion.  ?   Cervical back: Normal range of motion and neck supple.  ?Skin: ?   General: Skin is warm and dry.  ?Neurological:  ?  General: No focal deficit present.  ?   Mental Status: He is alert and oriented to person, place, and time. Mental status is at baseline.  ?Psychiatric:     ?   Mood and Affect: Mood normal.     ?   Behavior: Behavior normal.     ?   Thought Content: Thought content normal.     ?   Judgment: Judgment normal.  ?  ? ?Lab Results: ? ?CBC ?   ?Component Value Date/Time  ? WBC 10.5 01/26/2015 1408  ? RBC 4.54 01/26/2015 1408  ? HGB 15.0 01/26/2015 1408  ? HCT 43.8 01/26/2015 1408  ? PLT 204.0 01/26/2015 1408  ? MCV 96.5 01/26/2015 1408  ? MCHC 34.3 01/26/2015 1408  ? RDW 12.6 01/26/2015 1408  ? LYMPHSABS 1.6 01/26/2015 1408  ? MONOABS 1.1 (H) 01/26/2015 1408  ? EOSABS 0.4 01/26/2015 1408  ? BASOSABS 0.0 01/26/2015 1408  ? ? ?BMET ?   ?Component Value Date/Time  ? NA 139 01/26/2015 1408  ? K 4.1 01/26/2015 1408  ? CL 106 01/26/2015 1408  ? CO2 26 01/26/2015 1408  ? GLUCOSE 89 01/26/2015 1408  ? BUN 23 01/26/2015 1408  ? CREATININE 1.80 (H) 01/26/2015 1408  ? CALCIUM 9.4 01/26/2015 1408  ? ? ?BNP ?No results found for: BNP ? ?ProBNP ?   ?Component Value Date/Time  ? PROBNP 33.0 01/26/2015 1408  ? ? ?Imaging: ?DG Chest 2 View ? ?Result Date: 10/13/2021 ?CLINICAL DATA:  Dyspnea on exertion. EXAM: CHEST - 2 VIEW COMPARISON:   Chest x-ray 01/26/2015.  Chest CT 05/11/2015. FINDINGS: Again seen are medial right upper lobe opacities abutting the mediastinum. Pleuroparenchymal scarring in the right lung apex is also unchanged. No new focal

## 2021-11-07 NOTE — Assessment & Plan Note (Signed)
-   No evidence of lung collapse or atelectasis on most recent CT chest on 11/01/2021. He has stable chronic scarring right upper lobe.  ?

## 2021-11-08 ENCOUNTER — Other Ambulatory Visit: Payer: Self-pay | Admitting: *Deleted

## 2021-11-08 ENCOUNTER — Ambulatory Visit (INDEPENDENT_AMBULATORY_CARE_PROVIDER_SITE_OTHER): Payer: Medicare Other | Admitting: Pulmonary Disease

## 2021-11-08 DIAGNOSIS — J9811 Atelectasis: Secondary | ICD-10-CM

## 2021-11-08 LAB — PULMONARY FUNCTION TEST
DL/VA % pred: 77 %
DL/VA: 2.97 ml/min/mmHg/L
DLCO cor % pred: 69 %
DLCO cor: 16.55 ml/min/mmHg
DLCO unc % pred: 69 %
DLCO unc: 16.55 ml/min/mmHg
FEF 25-75 Post: 2.39 L/sec
FEF 25-75 Pre: 2 L/sec
FEF2575-%Change-Post: 19 %
FEF2575-%Pred-Post: 131 %
FEF2575-%Pred-Pre: 109 %
FEV1-%Change-Post: 4 %
FEV1-%Pred-Post: 95 %
FEV1-%Pred-Pre: 91 %
FEV1-Post: 2.61 L
FEV1-Pre: 2.51 L
FEV1FVC-%Change-Post: 0 %
FEV1FVC-%Pred-Pre: 105 %
FEV6-%Change-Post: 4 %
FEV6-%Pred-Post: 96 %
FEV6-%Pred-Pre: 92 %
FEV6-Post: 3.5 L
FEV6-Pre: 3.36 L
FEV6FVC-%Pred-Post: 107 %
FEV6FVC-%Pred-Pre: 107 %
FVC-%Change-Post: 4 %
FVC-%Pred-Post: 89 %
FVC-%Pred-Pre: 86 %
FVC-Post: 3.5 L
FVC-Pre: 3.36 L
Post FEV1/FVC ratio: 75 %
Post FEV6/FVC ratio: 100 %
Pre FEV1/FVC ratio: 75 %
Pre FEV6/FVC Ratio: 100 %
RV % pred: 44 %
RV: 1.2 L
TLC % pred: 70 %
TLC: 4.97 L

## 2021-11-08 NOTE — Patient Instructions (Signed)
Full PFT performed today. °

## 2021-11-08 NOTE — Progress Notes (Signed)
Full PFT performed today. °

## 2021-11-16 NOTE — Telephone Encounter (Signed)
Reviewed instructions for pre med again with patient and sent to him through [facility].  He had labs with oncology yesterday which should show in careeverywhere.  ?

## 2021-11-30 ENCOUNTER — Ambulatory Visit (HOSPITAL_COMMUNITY): Payer: Medicare Other

## 2021-12-05 ENCOUNTER — Ambulatory Visit (INDEPENDENT_AMBULATORY_CARE_PROVIDER_SITE_OTHER): Payer: Medicare Other | Admitting: Pulmonary Disease

## 2021-12-05 ENCOUNTER — Encounter: Payer: Self-pay | Admitting: Pulmonary Disease

## 2021-12-05 VITALS — BP 130/66 | HR 51 | Temp 97.1°F

## 2021-12-05 DIAGNOSIS — G20 Parkinson's disease: Secondary | ICD-10-CM | POA: Diagnosis not present

## 2021-12-05 DIAGNOSIS — R0609 Other forms of dyspnea: Secondary | ICD-10-CM

## 2021-12-05 DIAGNOSIS — R942 Abnormal results of pulmonary function studies: Secondary | ICD-10-CM

## 2021-12-05 DIAGNOSIS — J432 Centrilobular emphysema: Secondary | ICD-10-CM | POA: Diagnosis not present

## 2021-12-05 MED ORDER — STIOLTO RESPIMAT 2.5-2.5 MCG/ACT IN AERS: 2.0000 | INHALATION_SPRAY | Freq: Every day | RESPIRATORY_TRACT | 0 refills | Status: DC

## 2021-12-05 NOTE — Telephone Encounter (Signed)
Christopher Wilkerson  [facility] (supporting Icard, Bradley L, DO) 8 minutes ago (3:06 PM)  ? ?I have a partially functioning kidney on my right side. My creatinine levels range between 2.7 - 3.1. Will this steroid affect my renal functions.  ? ? ?Dr. Valeta Harms, please advise if Stiolto will affect kidney function. Thanks ?

## 2021-12-05 NOTE — Progress Notes (Signed)
? ?Synopsis: Referred in April 2023 for DOE by Seward Carol, MD ? ?Subjective:  ? ?PATIENT ID: Christopher Wilkerson GENDER: male DOB: 1936-07-27, MRN: [id number] ? ?Chief Complaint  ?Patient presents with  ? Follow-up  ?  SOB with exertion. No coughing or wheezing.  ? ? ?PMH prostate cancer, on injections, HTN, Kidney disease, emphysema. Former smoker, 15 years. Had blebs, second ptx.  From a respiratory standpoint he has been doing okay except for the past couple of weeks.  Patient developed worsening shortness of breath.  He had a recent echo and a stress test and was told this looked okay.  He did see a pulmonologist at [facility] a little over a year ago and was told to stop his inhalers and that his pulmonary function test looked pretty good.  His wife is worried that he has had continued decline over the past couple of weeks.  He had a recent chest x-ray which shows partial right upper lobe collapse. ? ?OV 12/05/2021: Since last office visit patient had pulmonary function test as well as CT complete.  Pulmonary function test revealed a normal ratio, normal spirometry no significant bronchodilator response TLC 70%, DLCO 69%.  CT imaging of the chest completed on 11/02/2021 reviewed today which revealed 6 mm right middle lobe nodule considered benign since seen on previous CT imaging no additional follow-up needed.  Annual screening needed for thoracic aneurysm.  Few mild changes consistent with centrilobular emphysema.  And coronary calcifications.  From respiratory standpoint he still feels short of breath with any significant exertion.  Today in the office we reviewed his PFTs and CT scan as described above. ? ? ?Past Medical History:  ?Diagnosis Date  ? Abdominal aortic aneurysm ([place])   ? Allergic   ? Aneurysm of infrarenal abdominal aorta (HCC)   ? Aortic atherosclerosis ([place])   ? Back pain   ? BPH (benign prostatic hyperplasia)   ? CKD (chronic kidney disease)   ? COPD (chronic obstructive pulmonary disease) ([place])    ? Cough due to ACE inhibitor   ? Dyslipidemia   ? ED (erectile dysfunction)   ? Hypercholesterolemia   ? Hyperlipidemia   ? Hypertension   ? Hypothyroid   ? Iatrogenic pulmonary embolism and infarction [facility])   ? OSA (obstructive sleep apnea)   ? on cpap faithfully  ? Pain in joint   ? Parkinson disease ([place])   ? Prostate cancer ([place])   ? Pulmonary nodule   ? Renal failure   ? Respiratory infection   ? Sleep apnea   ? Syncope   ?  ? ?Family History  ?Problem Relation Age of Onset  ? Heart disease Father   ?     Heart Disease before age 11  ? Hyperlipidemia Father   ? Hypertension Father   ? Heart attack Father   ? Ovarian cancer Mother   ? Lung cancer Mother   ? Cancer Mother   ?     Lung Cancer  ? Diabetes Mother   ? Hyperlipidemia Mother   ? Hypertension Mother   ? Diabetes Brother   ? Heart disease Brother   ?     Heart Disease before age 39  ? Hyperlipidemia Brother   ? Hypertension Brother   ? Heart attack Brother   ? Colon cancer Maternal Grandfather   ?  ? ?Past Surgical History:  ?Procedure Laterality Date  ? CATARACT EXTRACTION, BILATERAL    ? collapsed lung  2006  ? PROSTATE  CRYOABLATION    ? PROSTATE SURGERY    ? repair right index finger torn collatral ligament    ? vats for spontaneous ptx    ? ? ?Social History  ? ?Socioeconomic History  ? Marital status: Married  ?  Spouse name: Not on file  ? Number of children: 2  ? Years of education: Not on file  ? Highest education level: Not on file  ?Occupational History  ? Occupation: retired  ?  Comment: Recruitment consultant  ? Occupation: Herbalist  ?Tobacco Use  ? Smoking status: Former  ?  Packs/day: 2.00  ?  Types: Cigarettes  ?  Quit date: 01/29/1975  ?  Years since quitting: 46.8  ? Smokeless tobacco: Never  ?Vaping Use  ? Vaping Use: Never used  ?Substance and Sexual Activity  ? Alcohol use: Not Currently  ? Drug use: No  ? Sexual activity: Not on file  ?Other Topics Concern  ? Not on file  ?Social History Narrative  ? Right handed  ?    ? College  ?   ? Lives with wife  ? ?Social Determinants of Health  ? ?Financial Resource Strain: Not on file  ?Food Insecurity: Not on file  ?Transportation Needs: Not on file  ?Physical Activity: Not on file  ?Stress: Not on file  ?Social Connections: Not on file  ?Intimate Partner Violence: Not on file  ?  ? ?Allergies  ?Allergen Reactions  ? Adhesive  [Tape] Other (See Comments)  ?  OK with anything BUT paper tape: skin tears - Coban ok to use   ? Iodine   ? Iohexol   ?  ? ?Outpatient Medications Prior to Visit  ?Medication Sig Dispense Refill  ? albuterol (VENTOLIN HFA) 108 (90 Base) MCG/ACT inhaler Inhale 2 puffs into the lungs every 4 (four) hours as needed.      ? amLODipine (NORVASC) 10 MG tablet Take 10 mg by mouth daily.    ? carbidopa-levodopa (SINEMET IR) 25-100 MG tablet Take 1 tablet by mouth 3 (three) times daily. (Patient taking differently: Take 2 tablets by mouth 3 (three) times daily.) 270 tablet 1  ? cyanocobalamin (,VITAMIN B-12,) 1000 MCG/ML injection Inject 1,000 mcg into the muscle every 30 (thirty) days.    ? escitalopram (LEXAPRO) 10 MG tablet Take 10 mg by mouth daily.    ? fludrocortisone (FLORINEF) 0.1 MG tablet Take 100 mcg by mouth daily.    ? levothyroxine (SYNTHROID) 50 MCG tablet Take 50 mcg by mouth daily before breakfast.    ? fluticasone (FLONASE) 50 MCG/ACT nasal spray Place 1 spray into both nostrils daily as needed for allergies or rhinitis.    ? predniSONE (DELTASONE) 50 MG tablet Take as directed for contrast allergy 3 tablet 0  ? ?No facility-administered medications prior to visit.  ? ? ?Review of Systems  ?Constitutional:  Negative for chills, fever, malaise/fatigue and weight loss.  ?HENT:  Negative for hearing loss, sore throat and tinnitus.   ?Eyes:  Negative for blurred vision and double vision.  ?Respiratory:  Positive for shortness of breath. Negative for cough, hemoptysis, sputum production, wheezing and stridor.   ?Cardiovascular:  Negative for chest pain,  palpitations, orthopnea, leg swelling and PND.  ?Gastrointestinal:  Negative for abdominal pain, constipation, diarrhea, heartburn, nausea and vomiting.  ?Genitourinary:  Negative for dysuria, hematuria and urgency.  ?Musculoskeletal:  Negative for joint pain and myalgias.  ?Skin:  Negative for itching and rash.  ?Neurological:  Negative for dizziness, tingling, weakness  and headaches.  ?Endo/Heme/Allergies:  Negative for environmental allergies. Does not bruise/bleed easily.  ?Psychiatric/Behavioral:  Negative for depression. The patient is not nervous/anxious and does not have insomnia.   ?All other systems reviewed and are negative. ? ? ?Objective:  ?Physical Exam ?Vitals reviewed.  ?Constitutional:   ?   General: He is not in acute distress. ?   Appearance: He is well-developed.  ?HENT:  ?   Head: Normocephalic and atraumatic.  ?Eyes:  ?   General: No scleral icterus. ?   Conjunctiva/sclera: Conjunctivae normal.  ?   Pupils: Pupils are equal, round, and reactive to light.  ?Neck:  ?   Vascular: No JVD.  ?   Trachea: No tracheal deviation.  ?Cardiovascular:  ?   Rate and Rhythm: Normal rate and regular rhythm.  ?   Heart sounds: Normal heart sounds. No murmur heard. ?Pulmonary:  ?   Effort: Pulmonary effort is normal. No tachypnea, accessory muscle usage or respiratory distress.  ?   Breath sounds: No stridor. No wheezing, rhonchi or rales.  ?   Comments: Diminished breath sounds bilaterally,, may be some faint crackles in his bilateral bases ?Abdominal:  ?   General: There is no distension.  ?   Palpations: Abdomen is soft.  ?   Tenderness: There is no abdominal tenderness.  ?Musculoskeletal:     ?   General: No tenderness.  ?   Cervical back: Neck supple.  ?Lymphadenopathy:  ?   Cervical: No cervical adenopathy.  ?Skin: ?   General: Skin is warm and dry.  ?   Capillary Refill: Capillary refill takes less than 2 seconds.  ?   Findings: No rash.  ?Neurological:  ?   Mental Status: He is alert and oriented to  person, place, and time.  ?Psychiatric:     ?   Behavior: Behavior normal.  ? ? ? ?Vitals:  ? 12/05/21 1117  ?BP: 130/66  ?Pulse: (!) 51  ?Temp: (!) 97.1 ?F (36.2 ?C)  ?TempSrc: Oral  ?SpO2: 98%  ? ?98% on RA

## 2021-12-05 NOTE — Telephone Encounter (Signed)
Reading directions to get inhaler ready  ?list of health conditions that sound like mine. I have high blood pressure ,thyroid problems, active  prostate cancer and wet macular degeneration. Is this the inhaler for me? ? ? ?Dr. Valeta Harms, please advise. Thanks ?

## 2021-12-05 NOTE — Patient Instructions (Addendum)
Thank you for visiting Dr. Valeta Harms at [facility]. ?Today we recommend the following: ? ?Samples of stiolto  ?New prescription for stiolto ? ?Return in about 6 months (around 06/07/2022) for w/ Derl Barrow, NP . ? ? ? ?Please do your part to reduce the spread of COVID-19.  ? ?

## 2021-12-12 ENCOUNTER — Ambulatory Visit (INDEPENDENT_AMBULATORY_CARE_PROVIDER_SITE_OTHER): Payer: Medicare Other

## 2021-12-12 ENCOUNTER — Ambulatory Visit (INDEPENDENT_AMBULATORY_CARE_PROVIDER_SITE_OTHER): Payer: Medicare Other | Admitting: Cardiovascular Disease

## 2021-12-12 ENCOUNTER — Encounter: Payer: Self-pay | Admitting: Cardiovascular Disease

## 2021-12-12 VITALS — BP 158/80 | HR 53 | Ht 70.0 in | Wt 187.2 lb

## 2021-12-12 DIAGNOSIS — I712 Thoracic aortic aneurysm, without rupture, unspecified: Secondary | ICD-10-CM | POA: Diagnosis not present

## 2021-12-12 DIAGNOSIS — R001 Bradycardia, unspecified: Secondary | ICD-10-CM

## 2021-12-12 DIAGNOSIS — I351 Nonrheumatic aortic (valve) insufficiency: Secondary | ICD-10-CM

## 2021-12-12 DIAGNOSIS — R0609 Other forms of dyspnea: Secondary | ICD-10-CM | POA: Diagnosis not present

## 2021-12-12 DIAGNOSIS — I10 Essential (primary) hypertension: Secondary | ICD-10-CM

## 2021-12-12 NOTE — Progress Notes (Unsigned)
[id number] zio xt from office inventory applied to patient. ?

## 2021-12-12 NOTE — Progress Notes (Signed)
? ?Chief Complaint  ?Patient presents with  ? Follow-up  ?  dyspnea  ? ?History of Present Illness: 86 yo male with history of AAA, CKD, COPD, hyperlipidemia, HTN, hypothyroidism, sleep apnea, Parkinson's disease, prostate cancer and former tobacco abuse (stopped smoking in 1976), here today for cardiac follow up. I saw him in March 2023 as a new patient for the evaluation of dyspnea on exertion. He has no chest pain. He has noticed dyspnea with moderate exertion He has known COPD. EKG in primary care with sinus, LVH and PVCs. He was diagnosed with Parkinson's disease in 2021. He is generally very active. Some dizziness at home. Echo 10/26/21 with LVEF=50-55%, mild LVH, mild to moderate AI, mild MR. Nuclear stress test 10/26/21 with no ischemia. Mld dilation of the ascending aorta at 4.2 cm on chest CT 11/02/21.  ? ?He is here today for follow up. The patient denies any chest pain, palpitations, lower extremity edema, orthopnea, PND, dizziness, near syncope or syncope. He continues to have dyspnea and fatigue. BP swings at home and seems to drop when standing.  ? ?Primary Care Physician: Seward Carol, MD' ? ?Past Medical History:  ?Diagnosis Date  ? Abdominal aortic aneurysm ([place])   ? Allergic   ? Aneurysm of infrarenal abdominal aorta (HCC)   ? Aortic atherosclerosis ([place])   ? Back pain   ? BPH (benign prostatic hyperplasia)   ? CKD (chronic kidney disease)   ? COPD (chronic obstructive pulmonary disease) ([place])   ? Cough due to ACE inhibitor   ? Dyslipidemia   ? ED (erectile dysfunction)   ? Hypercholesterolemia   ? Hyperlipidemia   ? Hypertension   ? Hypothyroid   ? Iatrogenic pulmonary embolism and infarction [facility])   ? OSA (obstructive sleep apnea)   ? on cpap faithfully  ? Pain in joint   ? Parkinson disease ([place])   ? Prostate cancer ([place])   ? Pulmonary nodule   ? Renal failure   ? Respiratory infection   ? Sleep apnea   ? Syncope   ? ? ?Past Surgical History:  ?Procedure Laterality Date  ? CATARACT EXTRACTION,  BILATERAL    ? collapsed lung  2006  ? PROSTATE CRYOABLATION    ? PROSTATE SURGERY    ? repair right index finger torn collatral ligament    ? vats for spontaneous ptx    ? ? ?Current Outpatient Medications  ?Medication Sig Dispense Refill  ? albuterol (VENTOLIN HFA) 108 (90 Base) MCG/ACT inhaler Inhale 2 puffs into the lungs every 4 (four) hours as needed.      ? amLODipine (NORVASC) 10 MG tablet Take 10 mg by mouth daily.    ? carbidopa-levodopa (SINEMET IR) 25-100 MG tablet Take 1 tablet by mouth 3 (three) times daily. (Patient taking differently: Take 2 tablets by mouth 3 (three) times daily.) 270 tablet 1  ? cyanocobalamin (,VITAMIN B-12,) 1000 MCG/ML injection Inject 1,000 mcg into the muscle every 30 (thirty) days.    ? escitalopram (LEXAPRO) 10 MG tablet Take 10 mg by mouth daily.    ? levothyroxine (SYNTHROID) 50 MCG tablet Take 50 mcg by mouth daily before breakfast.    ? Tiotropium Bromide-Olodaterol (STIOLTO RESPIMAT) 2.5-2.5 MCG/ACT AERS Inhale 2 puffs into the lungs daily. 4 g 0  ? ?No current facility-administered medications for this visit.  ? ? ?Allergies  ?Allergen Reactions  ? Adhesive  [Tape] Other (See Comments)  ?  OK with anything BUT paper tape: skin tears - Coban ok  to use   ? Iodine   ? Iohexol   ? ? ?Social History  ? ?Socioeconomic History  ? Marital status: Married  ?  Spouse name: Not on file  ? Number of children: 2  ? Years of education: Not on file  ? Highest education level: Not on file  ?Occupational History  ? Occupation: retired  ?  Comment: Recruitment consultant  ? Occupation: Herbalist  ?Tobacco Use  ? Smoking status: Former  ?  Packs/day: 2.00  ?  Types: Cigarettes  ?  Quit date: 01/29/1975  ?  Years since quitting: 46.9  ? Smokeless tobacco: Never  ?Vaping Use  ? Vaping Use: Never used  ?Substance and Sexual Activity  ? Alcohol use: Not Currently  ? Drug use: No  ? Sexual activity: Not on file  ?Other Topics Concern  ? Not on file  ?Social History Narrative  ?  Right handed  ?   ? College  ?   ? Lives with wife  ? ?Social Determinants of Health  ? ?Financial Resource Strain: Not on file  ?Food Insecurity: Not on file  ?Transportation Needs: Not on file  ?Physical Activity: Not on file  ?Stress: Not on file  ?Social Connections: Not on file  ?Intimate Partner Violence: Not on file  ? ? ?Family History  ?Problem Relation Age of Onset  ? Heart disease Father   ?     Heart Disease before age 26  ? Hyperlipidemia Father   ? Hypertension Father   ? Heart attack Father   ? Ovarian cancer Mother   ? Lung cancer Mother   ? Cancer Mother   ?     Lung Cancer  ? Diabetes Mother   ? Hyperlipidemia Mother   ? Hypertension Mother   ? Diabetes Brother   ? Heart disease Brother   ?     Heart Disease before age 96  ? Hyperlipidemia Brother   ? Hypertension Brother   ? Heart attack Brother   ? Colon cancer Maternal Grandfather   ? ? ?Review of Systems:  As stated in the HPI and otherwise negative.  ? ?BP (!) 158/80   Pulse (!) 53   Ht '5\' 10"'$  (1.778 m)   Wt 187 lb 3.2 oz (84.9 kg)   SpO2 97%   BMI 26.86 kg/m?  ? ?Physical Examination: ? ?General: Well developed, well nourished, NAD  ?HEENT: OP clear, mucus membranes moist  ?SKIN: warm, dry. No rashes. ?Neuro: No focal deficits  ?Musculoskeletal: Muscle strength 5/5 all ext  ?Psychiatric: Mood and affect normal  ?Neck: No JVD, no carotid bruits, no thyromegaly, no lymphadenopathy.  ?Lungs:Clear bilaterally, no wheezes, rhonci, crackles ?Cardiovascular: Regular. Loletha Grayer. No murmurs, gallops or rubs. ?Abdomen:Soft. Bowel sounds present. Non-tender.  ?Extremities: No lower extremity edema. Pulses are 2 + in the bilateral DP/PT. ? ?EKG:  EKG  is not ordered today. ?The ekg ordered today demonstrates  ? ?Echo 10/26/21: ? ? 1. Left ventricular ejection fraction, by estimation, is 50 to 55%. The  ?left ventricle has low normal function. The left ventricle has no regional  ?wall motion abnormalities. There is mild concentric left ventricular   ?hypertrophy. Left ventricular  ?diastolic parameters are consistent with Grade I diastolic dysfunction  ?(impaired relaxation).  ? 2. Right ventricular systolic function is normal. The right ventricular  ?size is normal.  ? 3. The mitral valve is normal in structure. Mild mitral valve  ?regurgitation. No evidence of mitral stenosis.  ? 4.  The aortic valve is tricuspid. Aortic valve regurgitation is mild to  ?moderate. Aortic valve sclerosis/calcification is present, without any  ?evidence of aortic stenosis.  ? 5. There is borderline dilatation of the aortic root, measuring 37 mm.  ?There is moderate dilatation of the ascending aorta, measuring 40 mm.  ? 6. The inferior vena cava is normal in size with greater than 50%  ?respiratory variability, suggesting right atrial pressure of 3 mmHg.  ? ?Recent Labs: ?No results found for requested labs within last 8760 hours.  ? ?Lipid Panel ?No results found for: CHOL, TRIG, HDL, CHOLHDL, VLDL, LDLCALC, LDLDIRECT ?  ?Wt Readings from Last 3 Encounters:  ?12/12/21 187 lb 3.2 oz (84.9 kg)  ?11/07/21 188 lb 9.6 oz (85.5 kg)  ?10/31/21 187 lb 6.4 oz (85 kg)  ?  ?Assessment and Plan:  ? ?1. Dyspnea on exertion: His dyspnea is not felt to be cardiac related. Normal stress test March 2023. Normal LV systolic function by echo March 2023.  ? ?2. HTN: BP is controlled. See below regarding orthostatic hypotension ? ?3. Aortic insufficiency: Mild to moderate AI by echo March 2023. Repeat echo one year.  ? ?4. Thoracic aortic aneurysm: Mild dilation of ascending aorta at 4.2 cm April 2023.  ? ?5. Sinus bradycardia: He has sinus with rate of 53 bpm today. He reports fatigue now and swings in BP with ambulation. Parkinsons's disease for 3 years. I will arrange a 3 day Zio cardiac monitor to assess his heart rate at home and exclude heart block.  ? ?6. Orthostatic hypotension: BP drop today from 174/78 while supine to 111/66 when standing with HR from 50 to 60. This may be related to his  Parkinson's disease.  I have encouraged him to increase his po fluid intake. No med changes.  ? ?Labs/ tests ordered today include:  ? ?Orders Placed This Encounter  ?Procedures  ? LONG TERM MONITOR (3-14 DAYS)  ? ?

## 2021-12-12 NOTE — Patient Instructions (Addendum)
Medication Instructions:  ?No changes ?*If you need a refill on your cardiac medications before your next appointment, please call your pharmacy* ? ? ?Lab Work: ?none ? ? ?Testing/Procedures: ?Zio Heart Monitor - 3 days ? ? ?Follow-Up: ?At [facility], you and your health needs are our priority.  As part of our continuing mission to provide you with exceptional heart care, we have created designated Provider Care Teams.  These Care Teams include your primary Cardiologist (physician) and Advanced Practice Providers (APPs -  Physician Assistants and Nurse Practitioners) who all work together to provide you with the care you need, when you need it. ? ? ?Your next appointment:   ?1-2 month(s) ? ?The format for your next appointment:   ?In Person ? ?Provider:   ?Lauree Chandler, MD   ? ? ?Important Information About Sugar ? ? ? ? ?  ?

## 2021-12-25 ENCOUNTER — Encounter: Payer: Self-pay | Admitting: Cardiovascular Disease

## 2021-12-25 ENCOUNTER — Encounter: Payer: Self-pay | Admitting: Pulmonary Disease

## 2021-12-27 ENCOUNTER — Telehealth: Payer: Self-pay | Admitting: *Deleted

## 2021-12-27 DIAGNOSIS — R001 Bradycardia, unspecified: Secondary | ICD-10-CM

## 2021-12-27 DIAGNOSIS — R42 Dizziness and giddiness: Secondary | ICD-10-CM

## 2021-12-27 MED ORDER — STIOLTO RESPIMAT 2.5-2.5 MCG/ACT IN AERS: 2.0000 | INHALATION_SPRAY | Freq: Every day | RESPIRATORY_TRACT | 5 refills | Status: DC

## 2021-12-27 NOTE — Telephone Encounter (Signed)
-----   Message from [name], MD sent at 12/23/2021 11:50 AM EDT ----- His monitor shows sinus bradycardia at times and it appears that he is dizzy when he is having the bradycardia. It may be worthwhile to get him in to see EP and get their opinion. Also with some early beats during his diary entries for dizziness. Several short runs of SVT.

## 2021-12-27 NOTE — Telephone Encounter (Signed)
Called patient and informed.  Referral placed to EP.  Pt aware he will receive a call to schedule appointment.

## 2021-12-30 ENCOUNTER — Encounter: Payer: Self-pay | Admitting: Cardiovascular Disease

## 2022-01-09 ENCOUNTER — Encounter: Payer: Self-pay | Admitting: Internal Medicine

## 2022-01-09 ENCOUNTER — Ambulatory Visit (INDEPENDENT_AMBULATORY_CARE_PROVIDER_SITE_OTHER): Payer: Medicare Other | Admitting: Internal Medicine

## 2022-01-09 VITALS — Ht 70.0 in | Wt 186.0 lb

## 2022-01-09 DIAGNOSIS — R001 Bradycardia, unspecified: Secondary | ICD-10-CM | POA: Diagnosis not present

## 2022-01-09 DIAGNOSIS — R42 Dizziness and giddiness: Secondary | ICD-10-CM

## 2022-01-09 DIAGNOSIS — E859 Amyloidosis, unspecified: Secondary | ICD-10-CM

## 2022-01-09 MED ORDER — AMLODIPINE BESYLATE 2.5 MG PO TABS
2.5000 mg | ORAL_TABLET | Freq: Every day | ORAL | 3 refills | Status: DC
Start: 2022-01-09 — End: 2023-06-19

## 2022-01-09 MED ORDER — HYDRALAZINE HCL 25 MG PO TABS
25.0000 mg | ORAL_TABLET | Freq: Every day | ORAL | 3 refills | Status: DC
Start: 2022-01-09 — End: 2023-06-19

## 2022-01-09 NOTE — Patient Instructions (Addendum)
Medication Instructions:  Your physician has recommended you make the following change in your medication:   Begin Amlodipine 2.63m - 1 tablet by mouth daily  Begin Hydralazine 252m- 1 tablet by mouth at bedtime  *If you need a refill on your cardiac medications before your next appointment, please call your pharmacy*   Lab Work:  Multiple Myeloma Panel today  If you have labs (blood work) drawn today and your tests are completely normal, you will receive your results only by: [place] you have MyChart) OR A paper copy in the mail If you have any lab test that is abnormal or we need to change your treatment, we will call you to review the results.   Testing/Procedures: PYP scan for suspected Amyloid   Follow-Up: [facility] are our [facility].  As part of our continuing mission to provide you with exceptional heart care, we have created designated Provider Care Teams.  These Care Teams include your primary Cardiologist (physician) and Advanced Practice Providers (APPs -  Physician Assistants and Nurse Practitioners) who all work together to provide you with the care you need, when you need it.  We recommend signing up for the patient portal called "MyChart".  Sign up information is provided on this After Visit Summary.  MyChart is used to connect with patients for Virtual Visits (Telemedicine).  Patients are able to view lab/test results, encounter notes, upcoming appointments, etc.  Non-urgent messages can be sent to your provider as well.   To learn more about what you can do with MyChart, go to [email or web address]   Your next appointment:   4-6 week telephone visit with Dr KlCaryl ComesCompression wear as discussed by Dr KlCaryl Comes Recent studies have raised concern that fluoroquinolone antibiotics could be associated with an increased risk of aortic aneurysm or aortic dissection. You should avoid use of Cipro and other associated  antibiotics (flouroquinolone antibiotics )  It is  best to avoid activities that cause grunting or straining (medically referred to as a "valsalva maneuver"). This happens when a person bears down against a closed throat to increase the strength of arm or abdominal muscles. There's often a tendency to do this when lifting heavy weights, doing sit-ups, push-ups or chin-ups, etc., but it may be harmful.        Important Information About Sugar

## 2022-01-09 NOTE — Progress Notes (Signed)
ELECTROPHYSIOLOGY CONSULT NOTE  Patient ID: Christopher Wilkerson, MRN: [id number], DOB/AGE: 1936/03/19 86 y.o. Admit date: (Not on file) Date of Consult: 01/09/2022  Primary Physician: Seward Carol, MD Primary Cardiologist: [facility]     Christopher Wilkerson is a 86 y.o. male who is being seen today for the evaluation of bradycardia at the request of [facility].    HPI Christopher Wilkerson is a 86 y.o. male with a 2-year history of Parkinson's who was Seen originally by Dr. Elder Love with complaints of dyspnea on exertion in the context of known COPD.  Cardiac testing as below was unrevealing.  He has however also been appreciated to have profound orthostatic hypotension with delta blood pressures are greater than 100 mm and nadirs under 100 prompting the discontinuation of his amlodipine, a trial of fludrocortisone and then ProAmatine.  He is currently on Mestinon per his Christopher Wilkerson neurologist who follows him for Parkinson's disease.  He has exercise intolerance manifested primarily by weakness and lightheadedness much more so than dyspnea.  No chest pain.  No edema.  He has had multiple falls, frequent lightheadedness upon standing.  History of a Botox injection for prostatism resulting in inability to urinate and need for INO cath which she does multiple times a day  Event Recorder personnally reviewed   Sinus avg 55 (46-86);  SVT >> symptomatic PVCs/junctional rhythm  DATE TEST EF   3/23 Echo  50-55% Mild LVH-12 mm  3/23 Myoview     % No ischemia        Date Cr K Hgb  4/23 2.57 3.4 13   5/23 2.92 4.4    AAA Pulmonary Embolism CPAP   Past Medical History:  Diagnosis Date   Abdominal aortic aneurysm (HCC)    Allergic    Aneurysm of infrarenal abdominal aorta (HCC)    Aortic atherosclerosis (HCC)    Back pain    BPH (benign prostatic hyperplasia)    CKD (chronic kidney disease)    COPD (chronic obstructive pulmonary disease) (HCC)    Cough due to ACE inhibitor    Dyslipidemia    ED  (erectile dysfunction)    Hypercholesterolemia    Hyperlipidemia    Hypertension    Hypothyroid    Iatrogenic pulmonary embolism and infarction (HCC)    OSA (obstructive sleep apnea)    on cpap faithfully   Pain in joint    Parkinson disease ([place])    Prostate cancer ([place])    Pulmonary nodule    Renal failure    Respiratory infection    Sleep apnea    Syncope       Surgical History:  Past Surgical History:  Procedure Laterality Date   CATARACT EXTRACTION, BILATERAL     collapsed lung  2006   PROSTATE CRYOABLATION     PROSTATE SURGERY     repair right index finger torn collatral ligament     vats for spontaneous ptx       Home Meds: Current Meds  Medication Sig   albuterol (VENTOLIN HFA) 108 (90 Base) MCG/ACT inhaler Inhale 2 puffs into the lungs every 4 (four) hours as needed.     amantadine (SYMMETREL) 100 MG capsule 1 tablets   amLODipine (NORVASC) 10 MG tablet Take 10 mg by mouth daily.   cyanocobalamin (,VITAMIN B-12,) 1000 MCG/ML injection Inject 1,000 mcg into the muscle every 30 (thirty) days.   escitalopram (LEXAPRO) 10 MG tablet Take 10 mg by mouth daily.   levothyroxine (SYNTHROID)  50 MCG tablet Take 50 mcg by mouth daily before breakfast.   Tiotropium Bromide-Olodaterol (STIOLTO RESPIMAT) 2.5-2.5 MCG/ACT AERS Inhale 2 puffs into the lungs daily.    Allergies:  Allergies  Allergen Reactions   Adhesive  [Tape] Other (See Comments)    OK with anything BUT paper tape: skin tears - Coban ok to use    Iodine    Iohexol     Social History   Socioeconomic History   Marital status: Married    Spouse name: Not on file   Number of children: 2   Years of education: Not on file   Highest education level: Not on file  Occupational History   Occupation: retired    Comment: Recruitment consultant   Occupation: Retired-industrial Chief Financial Officer  Tobacco Use   Smoking status: Former    Packs/day: 2.00    Types: Cigarettes    Quit date: 01/29/1975    Years since  quitting: 46.9   Smokeless tobacco: Never  Vaping Use   Vaping Use: Never used  Substance and Sexual Activity   Alcohol use: Not Currently   Drug use: No   Sexual activity: Not on file  Other Topics Concern   Not on file  Social History Narrative   Right handed      [place]      Lives with wife   Social Determinants of Health   Financial Resource Strain: Not on file  Food Insecurity: Not on file  Transportation Needs: Not on file  Physical Activity: Not on file  Stress: Not on file  Social Connections: Not on file  Intimate Partner Violence: Not on file     Family History  Problem Relation Age of Onset   Heart disease Father        Heart Disease before age 75   Hyperlipidemia Father    Hypertension Father    Heart attack Father    Ovarian cancer Mother    Lung cancer Mother    Cancer Mother        Lung Cancer   Diabetes Mother    Hyperlipidemia Mother    Hypertension Mother    Diabetes Brother    Heart disease Brother        Heart Disease before age 73   Hyperlipidemia Brother    Hypertension Brother    Heart attack Brother    Colon cancer Maternal Grandfather      ROS:  Please see the history of present illness.     All other systems reviewed and negative.    Physical Exam: Blood pressure (!) 201/93, pulse (!) 51, height '5\' 10"'$  (1.778 m), weight 186 lb (84.4 kg). General: Well developed, well nourished male in no acute distress. Head: Normocephalic, atraumatic, sclera non-icteric, no xanthomas, nares are without discharge. EENT: normal  Lymph Nodes:  none Neck: Negative for carotid bruits. JVD not elevated. Back:without scoliosis kyphosis  Lungs: Clear bilaterally to auscultation without wheezes, rales, or rhonchi. Breathing is unlabored. Heart: RRR with S1 S2. No  murmur . No rubs, or gallops appreciated. Abdomen: Soft, non-tender, non-distended with normoactive bowel sounds. No hepatomegaly. No rebound/guarding. No obvious abdominal masses. Msk:   Strength and tone appear normal for age. Extremities: No clubbing or cyanosis. No  edema.  Distal pedal pulses are 2+ and equal bilaterally. Skin: Warm and Dry Neuro: Alert and oriented X 3. CN III-XII intact Grossly normal sensory and motor function .  Bilateral thenar weakness, tremor right hand greater than left Psych:  Responds to  questions appropriately with a normal affect.        EKG: sinus @ 53 19/11/44   Assessment and Plan:  Sinus bradycardia  ?  Chronotropic incompetence  Abdominal aortic aneurysm thoracic aortic aneurysm-mild  Orthostatic hypotension  Hypertension-systolic-supine  Bilateral hand weakness  Parkinson's disease  Aneurysm abdominal/thoracic .aneur  The patient has profound orthostatic hypotension and I think this is the main driver of his symptoms of exercise intolerance.  As his wife is appreciated the symptoms worsen over the first 20 or 30 feet, not a time where and bradycardia should manifest.  With his systolic supine hypertension and his profound orthostasis, to keep him off the ground I think is our highest priority.  Pharmacological therapy is going to be very limited in his benefits, fludrocortisone would not be appropriate given its augmentation of systolic supine hypertension.  ProAmatine might work although it has not been effective thus in him.  I am also not sanguine about the benefits of Mestinon as I wonder whether he is not a beginning of autonomic failure/Shy-Drager given the fact that his heart rate response to standing is so minimal.  Surprisingly, in this regard, he does not have symptoms of constipation which is almost universal.  We have discussed the physiology of orthostasis and the role of volume shifting.  Compression I think is going to be one of our most important tools, he is wearing calf socks, we talked about thigh sleeves as well as abdominal binding.  We will have him raise the head of his bed 6-8 inches.  We will give him  hydralazine at bedtime for his severe systolic supine hypertension i.e. numbers over 200 and we will start at 25 mg.  In this regard I have suggested that he use a urinal at night so as to mitigate any hypotensive effects from the hydralazine.  He also has bilateral hand weakness.  Concerning for carpal tunnel and in the context of his orthostasis, which likely is more related related to his Parkinson's, could be a manifestation of amyloid.  We will check a myeloma panel and a PYP scan.     Ascending Aortic Aneurysm/ Thoracic Aortic Aneurysm   Recent studies have raised concern that fluoroquinolone antibiotics could be associated with an increased risk of aortic aneurysm or aortic dissection. You should avoid use of Cipro and other associated antibiotics (flouroquinolone antibiotics )  It is  best to avoid activities that cause grunting or straining (medically referred to as a "valsalva maneuver"). This happens when a person bears down against a closed throat to increase the strength of arm or abdominal muscles. There's often a tendency to do this when lifting heavy weights, doing sit-ups, push-ups or chin-ups, etc., but it may be harmful.     Time 82 min   Christopher Wilkerson  .a

## 2022-01-10 ENCOUNTER — Encounter: Payer: Self-pay | Admitting: Internal Medicine

## 2022-01-11 NOTE — Telephone Encounter (Signed)
Looking through chart dont see so could you clarify please Prior use of Proamatine or Pyrdiostigmine? Also would suggest internet and [facility] may be a place to get the thigh compression, I suspect medical supply will have an abdominal binder

## 2022-01-12 LAB — MULTIPLE MYELOMA PANEL, SERUM
Albumin SerPl Elph-Mcnc: 4 g/dL (ref 2.9–4.4)
Albumin/Glob SerPl: 1.3 (ref 0.7–1.7)
Alpha 1: 0.2 g/dL (ref 0.0–0.4)
Alpha2 Glob SerPl Elph-Mcnc: 0.8 g/dL (ref 0.4–1.0)
B-Globulin SerPl Elph-Mcnc: 1 g/dL (ref 0.7–1.3)
Gamma Glob SerPl Elph-Mcnc: 1.2 g/dL (ref 0.4–1.8)
Globulin, Total: 3.1 g/dL (ref 2.2–3.9)
IgA/Immunoglobulin A, Serum: 259 mg/dL (ref 61–437)
IgG (Immunoglobin G), Serum: 1119 mg/dL (ref 603–1613)
IgM (Immunoglobulin M), Srm: 39 mg/dL (ref 15–143)
Total Protein: 7.1 g/dL (ref 6.0–8.5)

## 2022-01-13 ENCOUNTER — Encounter: Payer: Self-pay | Admitting: Internal Medicine

## 2022-01-13 NOTE — Telephone Encounter (Signed)
Spoke with pt and sent readings in at Dr Olin Pia request Per pt had dizzy spell yesterday sitting B/P was 151/84 and standing 136/72 Per pt feels like the dizziness with standing Is getting better Last B/P from yesterday was 109/67 after walking and felt fine Will forward to Dr Caryl Comes for review .Adonis Housekeeper

## 2022-01-17 ENCOUNTER — Telehealth (HOSPITAL_COMMUNITY): Payer: Self-pay | Admitting: *Deleted

## 2022-01-17 NOTE — Telephone Encounter (Signed)
Close encounter 

## 2022-01-18 ENCOUNTER — Encounter: Payer: Self-pay | Admitting: Internal Medicine

## 2022-01-18 ENCOUNTER — Ambulatory Visit (HOSPITAL_COMMUNITY)
Admission: RE | Admit: 2022-01-18 | Discharge: 2022-01-18 | Disposition: A | Discharge status: Home / Self Care | Payer: Medicare Other | Source: Ambulatory Visit | Attending: Cardiovascular Disease | Admitting: Cardiovascular Disease

## 2022-01-18 DIAGNOSIS — R001 Bradycardia, unspecified: Secondary | ICD-10-CM | POA: Diagnosis present

## 2022-01-18 DIAGNOSIS — E859 Amyloidosis, unspecified: Secondary | ICD-10-CM | POA: Diagnosis present

## 2022-01-18 DIAGNOSIS — R42 Dizziness and giddiness: Secondary | ICD-10-CM | POA: Diagnosis present

## 2022-01-18 MED ORDER — TECHNETIUM TC 99M PYROPHOSPHATE
21.0000 | Freq: Once | INTRAVENOUS | Status: AC
  Administered 2022-01-18: 21 via INTRAVENOUS

## 2022-01-25 ENCOUNTER — Encounter: Payer: Self-pay | Admitting: Cardiovascular Disease

## 2022-01-25 ENCOUNTER — Ambulatory Visit (INDEPENDENT_AMBULATORY_CARE_PROVIDER_SITE_OTHER): Payer: Medicare Other | Admitting: Cardiovascular Disease

## 2022-01-25 VITALS — BP 150/70 | HR 55 | Ht 70.0 in | Wt 185.8 lb

## 2022-01-25 DIAGNOSIS — I712 Thoracic aortic aneurysm, without rupture, unspecified: Secondary | ICD-10-CM | POA: Diagnosis not present

## 2022-01-25 DIAGNOSIS — I951 Orthostatic hypotension: Secondary | ICD-10-CM

## 2022-01-25 DIAGNOSIS — I351 Nonrheumatic aortic (valve) insufficiency: Secondary | ICD-10-CM

## 2022-01-25 DIAGNOSIS — R0609 Other forms of dyspnea: Secondary | ICD-10-CM | POA: Diagnosis not present

## 2022-01-25 DIAGNOSIS — R001 Bradycardia, unspecified: Secondary | ICD-10-CM

## 2022-01-25 DIAGNOSIS — I10 Essential (primary) hypertension: Secondary | ICD-10-CM | POA: Diagnosis not present

## 2022-01-25 NOTE — Patient Instructions (Signed)
Medication Instructions:  Your physician recommends that you continue on your current medications as directed. Please refer to the Current Medication list given to you today.  *If you need a refill on your cardiac medications before your next appointment, please call your pharmacy*   Follow-Up: At [facility], you and your health needs are our priority.  As part of our continuing mission to provide you with exceptional heart care, we have created designated Provider Care Teams.  These Care Teams include your primary Cardiologist (physician) and Advanced Practice Providers (APPs -  Physician Assistants and Nurse Practitioners) who all work together to provide you with the care you need, when you need it.   Your next appointment:   1 year(s)  The format for your next appointment:   In Person  Provider:   Christopher McAlhany, MD {    Important Information About Sugar       

## 2022-01-25 NOTE — Progress Notes (Signed)
Chief Complaint  Patient presents with   Follow-up    dizziness   History of Present Illness: 87 yo male with history of AAA, CKD, COPD, hyperlipidemia, HTN, hypothyroidism, sleep apnea, Parkinson's disease, prostate cancer and former tobacco abuse (stopped smoking in 1976), here today for cardiac follow up. I saw him in March 2023 as a new patient for the evaluation of dyspnea on exertion. He has no chest pain. He has noticed dyspnea with moderate exertion. He has known COPD. EKG in primary care with sinus, LVH and PVCs. He was diagnosed with Parkinson's disease in 2021. He is generally very active. Some dizziness at home. Echo 10/26/21 with LVEF=50-55%, mild LVH, mild to moderate AI, mild MR. Nuclear stress test 10/26/21 with no ischemia. Mld dilation of the ascending aorta at 4.2 cm on chest CT 11/02/21. At his visit here in may 2023, he reported on going fatigue and dyspnea with BP swings at home. Amlodipine has been stopped and he has had trials of fludrocortisone and then Proamatine. He is on Mestinon per his [facility] neurologist who follows his Parkinsons's disease. 3 day cardiac monitor May 2023 with sinus bradycardia associated with dizziness. Several short runs of SVT. He was seen in the EP clinic by Dr. Caryl Comes who suggested compression stockings, abdominal binding and nighttime hydralazine. Myocardial amyloid imaging equivocal for amyloidosis.   He is here today for follow up. The patient denies any chest pain, palpitations, lower extremity edema, orthopnea, PND.  His dizziness has improved with the recent abdominal binder and compression stockings. No dyspnea. He still has gait instability.      Primary Care Physician: Seward Carol, MD'  Past Medical History:  Diagnosis Date   Abdominal aortic aneurysm [facility])    Allergic    Aneurysm of infrarenal abdominal aorta (HCC)    Aortic atherosclerosis (HCC)    Back pain    BPH (benign prostatic hyperplasia)    CKD (chronic kidney disease)     COPD (chronic obstructive pulmonary disease) (HCC)    Cough due to ACE inhibitor    Dyslipidemia    ED (erectile dysfunction)    Hypercholesterolemia    Hyperlipidemia    Hypertension    Hypothyroid    Iatrogenic pulmonary embolism and infarction (HCC)    OSA (obstructive sleep apnea)    on cpap faithfully   Pain in joint    Parkinson disease ([place])    Prostate cancer ([place])    Pulmonary nodule    Renal failure    Respiratory infection    Sleep apnea    Syncope     Past Surgical History:  Procedure Laterality Date   CATARACT EXTRACTION, BILATERAL     collapsed lung  2006   PROSTATE CRYOABLATION     PROSTATE SURGERY     repair right index finger torn collatral ligament     vats for spontaneous ptx      Current Outpatient Medications  Medication Sig Dispense Refill   albuterol (VENTOLIN HFA) 108 (90 Base) MCG/ACT inhaler Inhale 2 puffs into the lungs every 4 (four) hours as needed.       amantadine (SYMMETREL) 100 MG capsule 1 tablets     amLODipine (NORVASC) 2.5 MG tablet Take 1 tablet (2.5 mg total) by mouth daily. 90 tablet 3   carbidopa-levodopa (SINEMET IR) 25-100 MG tablet Take 1 tablet by mouth 3 (three) times daily. 270 tablet 1   cyanocobalamin (,VITAMIN B-12,) 1000 MCG/ML injection Inject 1,000 mcg into the muscle every 30 (  thirty) days.     escitalopram (LEXAPRO) 10 MG tablet Take 10 mg by mouth daily.     hydrALAZINE (APRESOLINE) 25 MG tablet Take 1 tablet (25 mg total) by mouth at bedtime. 90 tablet 3   levothyroxine (SYNTHROID) 50 MCG tablet Take 50 mcg by mouth daily before breakfast.     Tiotropium Bromide-Olodaterol (STIOLTO RESPIMAT) 2.5-2.5 MCG/ACT AERS Inhale 2 puffs into the lungs daily. 4 g 5   No current facility-administered medications for this visit.    Allergies  Allergen Reactions   Adhesive  [Tape] Other (See Comments)    OK with anything BUT paper tape: skin tears - Coban ok to use    Iodine    Iohexol     Social History    Socioeconomic History   Marital status: Married    Spouse name: Not on file   Number of children: 2   Years of education: Not on file   Highest education level: Not on file  Occupational History   Occupation: retired    Comment: Recruitment consultant   Occupation: Retired-industrial Chief Financial Officer  Tobacco Use   Smoking status: Former    Packs/day: 2.00    Types: Cigarettes    Quit date: 01/29/1975    Years since quitting: 47.0   Smokeless tobacco: Never  Vaping Use   Vaping Use: Never used  Substance and Sexual Activity   Alcohol use: Not Currently   Drug use: No   Sexual activity: Not on file  Other Topics Concern   Not on file  Social History Narrative   Right handed      [place]      Lives with wife   Social Determinants of Health   Financial Resource Strain: Not on file  Food Insecurity: Not on file  Transportation Needs: Not on file  Physical Activity: Not on file  Stress: Not on file  Social Connections: Not on file  Intimate Partner Violence: Not on file    Family History  Problem Relation Age of Onset   Heart disease Father        Heart Disease before age 50   Hyperlipidemia Father    Hypertension Father    Heart attack Father    Ovarian cancer Mother    Lung cancer Mother    Cancer Mother        Lung Cancer   Diabetes Mother    Hyperlipidemia Mother    Hypertension Mother    Diabetes Brother    Heart disease Brother        Heart Disease before age 75   Hyperlipidemia Brother    Hypertension Brother    Heart attack Brother    Colon cancer Maternal Grandfather     Review of Systems:  As stated in the HPI and otherwise negative.   BP (!) 150/70   Pulse (!) 55   Ht '5\' 10"'$  (1.778 m)   Wt 185 lb 12.8 oz (84.3 kg)   SpO2 98%   BMI 26.66 kg/m   Physical Examination:  General: Well developed, well nourished, NAD  HEENT: OP clear, mucus membranes moist  SKIN: warm, dry. No rashes. Neuro: No focal deficits  Musculoskeletal: Muscle strength  5/5 all ext  Psychiatric: Mood and affect normal  Neck: No JVD, no carotid bruits, no thyromegaly, no lymphadenopathy.  Lungs:Clear bilaterally, no wheezes, rhonci, crackles Cardiovascular: Regular rate and rhythm. No murmurs, gallops or rubs. Abdomen:Soft. Bowel sounds present. Non-tender.  Extremities: No lower extremity edema. Pulses are 2 +  in the bilateral DP/PT.  EKG:  EKG  is not ordered today. The ekg ordered today demonstrates   Echo 10/26/21:  1. Left ventricular ejection fraction, by estimation, is 50 to 55%. The  left ventricle has low normal function. The left ventricle has no regional  wall motion abnormalities. There is mild concentric left ventricular  hypertrophy. Left ventricular  diastolic parameters are consistent with Grade I diastolic dysfunction  (impaired relaxation).   2. Right ventricular systolic function is normal. The right ventricular  size is normal.   3. The mitral valve is normal in structure. Mild mitral valve  regurgitation. No evidence of mitral stenosis.   4. The aortic valve is tricuspid. Aortic valve regurgitation is mild to  moderate. Aortic valve sclerosis/calcification is present, without any  evidence of aortic stenosis.   5. There is borderline dilatation of the aortic root, measuring 37 mm.  There is moderate dilatation of the ascending aorta, measuring 40 mm.   6. The inferior vena cava is normal in size with greater than 50%  respiratory variability, suggesting right atrial pressure of 3 mmHg.   Recent Labs: No results found for requested labs within last 365 days.   Lipid Panel No results found for: "CHOL", "TRIG", "HDL", "CHOLHDL", "VLDL", "LDLCALC", "LDLDIRECT"   Wt Readings from Last 3 Encounters:  01/25/22 185 lb 12.8 oz (84.3 kg)  01/18/22 186 lb (84.4 kg)  01/09/22 186 lb (84.4 kg)    Assessment and Plan:   1. Dyspnea on exertion: His dyspnea is not felt to be cardiac related. Normal stress test March 2023. Normal LV  systolic function by echo March 2023. No significant valve disease.   2. HTN: BP is controlled. See below regarding orthostatic hypotension  3. Aortic insufficiency: Mild to moderate AI by echo March 2023. Repeat echo one year.   4. Thoracic aortic aneurysm: Mild dilation of ascending aorta at 4.2 cm April 2023. Repeat in April 2024.   5. Sinus bradycardia: Not felt to be contributing to his dizziness when seen in the EP clinic. No indication for a pacemaker.   6. Orthostatic hypotension: Per recs at the visit with Dr. Caryl Comes, continue compression stockings and abdominal binding. Dizziness is resolved. He has f/u with Dr. Caryl Comes in 2 weeks.   7. SVT: Likely not contributing to his dizziness  Labs/ tests ordered today include:   No orders of the defined types were placed in this encounter.  Disposition:   F/U with me in 12 months.    Signed, Lauree Chandler, MD 01/25/2022 3:31 PM    [facility] [address], [place], [place]  [address] Phone: [phone]; Fax: [phone]

## 2022-01-26 NOTE — Telephone Encounter (Signed)
Pt seen by Dr Angelena Form in clinic yesterday 01/25/2022 (See note for complete details) and is scheduled with Dr Caryl Comes on 02/01/2022.

## 2022-02-03 ENCOUNTER — Institutional Professional Consult (permissible substitution): Payer: Medicare Other | Admitting: Internal Medicine

## 2022-02-10 ENCOUNTER — Telehealth: Payer: Medicare Other | Admitting: Internal Medicine

## 2022-02-28 DEATH — deceased

## 2022-03-02 ENCOUNTER — Telehealth: Payer: Medicare Other | Admitting: Internal Medicine

## 2022-03-13 ENCOUNTER — Telehealth: Payer: Medicare Other | Admitting: Internal Medicine
